# Patient Record
Sex: Female | Born: 1955
Health system: Southern US, Community
[De-identification: ages and names within clinical notes are randomized; demographics above are authoritative.]

## PROBLEM LIST (undated history)

## (undated) DIAGNOSIS — T7840XA Allergy, unspecified, initial encounter: Secondary | ICD-10-CM

## (undated) DIAGNOSIS — I2510 Atherosclerotic heart disease of native coronary artery without angina pectoris: Secondary | ICD-10-CM

## (undated) DIAGNOSIS — I10 Essential (primary) hypertension: Secondary | ICD-10-CM

## (undated) DIAGNOSIS — G629 Polyneuropathy, unspecified: Secondary | ICD-10-CM

## (undated) DIAGNOSIS — Z803 Family history of malignant neoplasm of breast: Secondary | ICD-10-CM

## (undated) DIAGNOSIS — G620 Drug-induced polyneuropathy: Secondary | ICD-10-CM

## (undated) DIAGNOSIS — R011 Cardiac murmur, unspecified: Secondary | ICD-10-CM

## (undated) DIAGNOSIS — E785 Hyperlipidemia, unspecified: Secondary | ICD-10-CM

## (undated) DIAGNOSIS — T451X5A Adverse effect of antineoplastic and immunosuppressive drugs, initial encounter: Secondary | ICD-10-CM

## (undated) DIAGNOSIS — C801 Malignant (primary) neoplasm, unspecified: Secondary | ICD-10-CM

## (undated) DIAGNOSIS — G709 Myoneural disorder, unspecified: Secondary | ICD-10-CM

## (undated) DIAGNOSIS — C50919 Malignant neoplasm of unspecified site of unspecified female breast: Secondary | ICD-10-CM

## (undated) DIAGNOSIS — J449 Chronic obstructive pulmonary disease, unspecified: Secondary | ICD-10-CM

## (undated) HISTORY — DX: Cardiac murmur, unspecified: R01.1

## (undated) HISTORY — DX: Myoneural disorder, unspecified: G70.9

## (undated) HISTORY — PX: LYMPH GLAND EXCISION: SHX13

## (undated) HISTORY — DX: Chronic obstructive pulmonary disease, unspecified: J44.9

## (undated) HISTORY — PX: OVARY SURGERY: SHX727

## (undated) HISTORY — DX: Hyperlipidemia, unspecified: E78.5

## (undated) HISTORY — PX: TONSILLECTOMY: SUR1361

## (undated) HISTORY — DX: Essential (primary) hypertension: I10

## (undated) HISTORY — DX: Malignant neoplasm of unspecified site of unspecified female breast: C50.919

## (undated) HISTORY — DX: Family history of malignant neoplasm of breast: Z80.3

## (undated) HISTORY — PX: CARPAL TUNNEL RELEASE: SHX101

## (undated) HISTORY — PX: ECTOPIC PREGNANCY SURGERY: SHX613

## (undated) HISTORY — DX: Adverse effect of antineoplastic and immunosuppressive drugs, initial encounter: G62.0

## (undated) HISTORY — DX: Drug-induced polyneuropathy: T45.1X5A

## (undated) HISTORY — DX: Atherosclerotic heart disease of native coronary artery without angina pectoris: I25.10

## (undated) HISTORY — PX: BREAST RECONSTRUCTION: SHX9

## (undated) HISTORY — DX: Polyneuropathy, unspecified: G62.9

## (undated) HISTORY — PX: APPENDECTOMY: SHX54

## (undated) HISTORY — DX: Allergy, unspecified, initial encounter: T78.40XA

---

## 1997-09-08 DIAGNOSIS — R011 Cardiac murmur, unspecified: Secondary | ICD-10-CM

## 1997-09-08 HISTORY — DX: Cardiac murmur, unspecified: R01.1

## 1998-03-20 ENCOUNTER — Other Ambulatory Visit: Admission: RE | Admit: 1998-03-20 | Discharge: 1998-03-20 | Payer: Self-pay | Admitting: Gynecology

## 1998-04-19 ENCOUNTER — Other Ambulatory Visit: Admission: RE | Admit: 1998-04-19 | Discharge: 1998-04-19 | Payer: Self-pay | Admitting: General Surgery

## 1998-04-30 ENCOUNTER — Ambulatory Visit (HOSPITAL_COMMUNITY): Admission: RE | Admit: 1998-04-30 | Discharge: 1998-04-30 | Payer: Self-pay | Admitting: General Surgery

## 1998-05-04 ENCOUNTER — Encounter: Admission: RE | Admit: 1998-05-04 | Discharge: 1998-08-02 | Payer: Self-pay | Admitting: General Surgery

## 1998-05-25 ENCOUNTER — Ambulatory Visit (HOSPITAL_COMMUNITY): Admission: RE | Admit: 1998-05-25 | Discharge: 1998-05-25 | Payer: Self-pay | Admitting: General Surgery

## 1998-05-25 ENCOUNTER — Encounter: Payer: Self-pay | Admitting: General Surgery

## 1998-06-19 ENCOUNTER — Encounter: Payer: Self-pay | Admitting: Hematology and Oncology

## 1998-06-19 ENCOUNTER — Ambulatory Visit (HOSPITAL_COMMUNITY): Admission: RE | Admit: 1998-06-19 | Discharge: 1998-06-19 | Payer: Self-pay | Admitting: Hematology and Oncology

## 1998-09-05 ENCOUNTER — Encounter: Admission: RE | Admit: 1998-09-05 | Discharge: 1998-12-04 | Payer: Self-pay | Admitting: Radiation Oncology

## 1999-08-29 ENCOUNTER — Other Ambulatory Visit: Admission: RE | Admit: 1999-08-29 | Discharge: 1999-08-29 | Payer: Self-pay | Admitting: Gynecology

## 1999-09-09 HISTORY — PX: TOTAL ABDOMINAL HYSTERECTOMY: SHX209

## 1999-09-16 ENCOUNTER — Encounter: Payer: Self-pay | Admitting: Hematology and Oncology

## 1999-09-16 ENCOUNTER — Encounter: Admission: RE | Admit: 1999-09-16 | Discharge: 1999-09-16 | Payer: Self-pay | Admitting: Hematology and Oncology

## 1999-09-17 ENCOUNTER — Other Ambulatory Visit: Admission: RE | Admit: 1999-09-17 | Discharge: 1999-09-17 | Payer: Self-pay | Admitting: Orthopedic Surgery

## 2000-03-16 ENCOUNTER — Encounter: Payer: Self-pay | Admitting: Hematology and Oncology

## 2000-03-16 ENCOUNTER — Encounter: Admission: RE | Admit: 2000-03-16 | Discharge: 2000-03-16 | Payer: Self-pay | Admitting: Hematology and Oncology

## 2000-04-02 ENCOUNTER — Encounter: Admission: RE | Admit: 2000-04-02 | Discharge: 2000-04-02 | Payer: Self-pay | Admitting: Hematology and Oncology

## 2000-04-02 ENCOUNTER — Encounter: Payer: Self-pay | Admitting: Hematology and Oncology

## 2000-09-17 ENCOUNTER — Encounter: Payer: Self-pay | Admitting: Hematology and Oncology

## 2000-09-17 ENCOUNTER — Encounter: Admission: RE | Admit: 2000-09-17 | Discharge: 2000-09-17 | Payer: Self-pay | Admitting: Hematology and Oncology

## 2001-04-15 ENCOUNTER — Other Ambulatory Visit: Admission: RE | Admit: 2001-04-15 | Discharge: 2001-04-15 | Payer: Self-pay | Admitting: Gynecology

## 2001-04-19 ENCOUNTER — Encounter: Payer: Self-pay | Admitting: Hematology and Oncology

## 2001-04-19 ENCOUNTER — Encounter: Admission: RE | Admit: 2001-04-19 | Discharge: 2001-04-19 | Payer: Self-pay | Admitting: Hematology and Oncology

## 2001-08-03 ENCOUNTER — Ambulatory Visit: Admission: RE | Admit: 2001-08-03 | Discharge: 2001-08-03 | Payer: Self-pay | Admitting: Gynecology

## 2001-08-09 ENCOUNTER — Encounter: Payer: Self-pay | Admitting: Gynecology

## 2001-08-10 ENCOUNTER — Encounter (INDEPENDENT_AMBULATORY_CARE_PROVIDER_SITE_OTHER): Payer: Self-pay

## 2001-08-10 ENCOUNTER — Inpatient Hospital Stay (HOSPITAL_COMMUNITY): Admission: RE | Admit: 2001-08-10 | Discharge: 2001-08-11 | Payer: Self-pay | Admitting: Gynecology

## 2001-09-23 ENCOUNTER — Encounter: Admission: RE | Admit: 2001-09-23 | Discharge: 2001-09-23 | Payer: Self-pay | Admitting: Hematology and Oncology

## 2001-09-23 ENCOUNTER — Encounter: Payer: Self-pay | Admitting: Hematology and Oncology

## 2001-12-06 ENCOUNTER — Encounter: Admission: RE | Admit: 2001-12-06 | Discharge: 2001-12-06 | Payer: Self-pay | Admitting: Hematology and Oncology

## 2001-12-06 ENCOUNTER — Encounter: Payer: Self-pay | Admitting: Hematology and Oncology

## 2002-05-26 ENCOUNTER — Encounter: Payer: Self-pay | Admitting: Oncology

## 2002-05-26 ENCOUNTER — Encounter: Admission: RE | Admit: 2002-05-26 | Discharge: 2002-05-26 | Payer: Self-pay | Admitting: Oncology

## 2002-09-28 ENCOUNTER — Encounter: Payer: Self-pay | Admitting: Oncology

## 2002-09-28 ENCOUNTER — Encounter: Admission: RE | Admit: 2002-09-28 | Discharge: 2002-09-28 | Payer: Self-pay | Admitting: Oncology

## 2002-12-29 ENCOUNTER — Other Ambulatory Visit: Admission: RE | Admit: 2002-12-29 | Discharge: 2002-12-29 | Payer: Self-pay | Admitting: Gynecology

## 2003-10-25 ENCOUNTER — Encounter: Admission: RE | Admit: 2003-10-25 | Discharge: 2003-10-25 | Payer: Self-pay | Admitting: Oncology

## 2004-08-14 ENCOUNTER — Other Ambulatory Visit: Admission: RE | Admit: 2004-08-14 | Discharge: 2004-08-14 | Payer: Self-pay | Admitting: Gynecology

## 2004-09-08 HISTORY — PX: MASTECTOMY: SHX3

## 2004-10-28 ENCOUNTER — Encounter: Admission: RE | Admit: 2004-10-28 | Discharge: 2004-10-28 | Payer: Self-pay | Admitting: Oncology

## 2004-11-05 ENCOUNTER — Encounter: Admission: RE | Admit: 2004-11-05 | Discharge: 2004-11-05 | Payer: Self-pay | Admitting: Oncology

## 2004-11-06 ENCOUNTER — Encounter (INDEPENDENT_AMBULATORY_CARE_PROVIDER_SITE_OTHER): Payer: Self-pay | Admitting: Specialist

## 2004-11-06 ENCOUNTER — Encounter: Admission: RE | Admit: 2004-11-06 | Discharge: 2004-11-06 | Payer: Self-pay | Admitting: Oncology

## 2004-11-12 ENCOUNTER — Encounter (INDEPENDENT_AMBULATORY_CARE_PROVIDER_SITE_OTHER): Payer: Self-pay | Admitting: Specialist

## 2004-11-12 ENCOUNTER — Encounter: Admission: RE | Admit: 2004-11-12 | Discharge: 2004-11-12 | Payer: Self-pay | Admitting: Oncology

## 2004-11-12 ENCOUNTER — Encounter (INDEPENDENT_AMBULATORY_CARE_PROVIDER_SITE_OTHER): Payer: Self-pay | Admitting: Diagnostic Radiology

## 2005-01-06 ENCOUNTER — Inpatient Hospital Stay (HOSPITAL_COMMUNITY): Admission: RE | Admit: 2005-01-06 | Discharge: 2005-01-08 | Payer: Self-pay | Admitting: Surgery

## 2005-01-06 ENCOUNTER — Encounter (INDEPENDENT_AMBULATORY_CARE_PROVIDER_SITE_OTHER): Payer: Self-pay | Admitting: *Deleted

## 2005-02-05 ENCOUNTER — Ambulatory Visit: Payer: Self-pay | Admitting: Oncology

## 2005-02-11 ENCOUNTER — Ambulatory Visit (HOSPITAL_COMMUNITY): Admission: RE | Admit: 2005-02-11 | Discharge: 2005-02-11 | Payer: Self-pay | Admitting: Oncology

## 2005-02-12 ENCOUNTER — Ambulatory Visit (HOSPITAL_COMMUNITY): Admission: RE | Admit: 2005-02-12 | Discharge: 2005-02-12 | Payer: Self-pay | Admitting: Oncology

## 2005-02-19 ENCOUNTER — Ambulatory Visit (HOSPITAL_COMMUNITY): Admission: RE | Admit: 2005-02-19 | Discharge: 2005-02-19 | Payer: Self-pay | Admitting: Oncology

## 2005-03-03 ENCOUNTER — Ambulatory Visit: Payer: Self-pay | Admitting: Oncology

## 2005-03-27 ENCOUNTER — Ambulatory Visit: Payer: Self-pay | Admitting: Oncology

## 2005-05-13 ENCOUNTER — Ambulatory Visit: Payer: Self-pay | Admitting: Oncology

## 2005-06-20 ENCOUNTER — Inpatient Hospital Stay (HOSPITAL_COMMUNITY): Admission: RE | Admit: 2005-06-20 | Discharge: 2005-06-23 | Payer: Self-pay | Admitting: Plastic Surgery

## 2005-10-01 ENCOUNTER — Ambulatory Visit: Payer: Self-pay | Admitting: Oncology

## 2005-10-18 ENCOUNTER — Emergency Department (HOSPITAL_COMMUNITY): Admission: EM | Admit: 2005-10-18 | Discharge: 2005-10-18 | Payer: Self-pay | Admitting: Emergency Medicine

## 2006-01-01 ENCOUNTER — Encounter: Admission: RE | Admit: 2006-01-01 | Discharge: 2006-01-01 | Payer: Self-pay | Admitting: Neurology

## 2006-01-20 ENCOUNTER — Ambulatory Visit: Payer: Self-pay | Admitting: Oncology

## 2006-01-22 LAB — CBC WITH DIFFERENTIAL/PLATELET
BASO%: 0.1 % (ref 0.0–2.0)
HCT: 39.1 % (ref 34.8–46.6)
LYMPH%: 14.9 % (ref 14.0–48.0)
MCHC: 34.3 g/dL (ref 32.0–36.0)
MCV: 89.1 fL (ref 81.0–101.0)
MONO#: 0.5 10*3/uL (ref 0.1–0.9)
NEUT%: 77.2 % — ABNORMAL HIGH (ref 39.6–76.8)
Platelets: 219 10*3/uL (ref 145–400)
WBC: 11 10*3/uL — ABNORMAL HIGH (ref 3.9–10.0)

## 2006-01-22 LAB — COMPREHENSIVE METABOLIC PANEL
AST: 12 U/L (ref 0–37)
BUN: 14 mg/dL (ref 6–23)
CO2: 27 mEq/L (ref 19–32)
Calcium: 9 mg/dL (ref 8.4–10.5)
Chloride: 100 mEq/L (ref 96–112)
Creatinine, Ser: 0.8 mg/dL (ref 0.4–1.2)

## 2006-05-06 ENCOUNTER — Other Ambulatory Visit: Admission: RE | Admit: 2006-05-06 | Discharge: 2006-05-06 | Payer: Self-pay | Admitting: Gynecology

## 2006-07-23 ENCOUNTER — Ambulatory Visit: Payer: Self-pay | Admitting: Oncology

## 2006-07-27 LAB — CBC WITH DIFFERENTIAL/PLATELET
BASO%: 0.3 % (ref 0.0–2.0)
EOS%: 3.6 % (ref 0.0–7.0)
MCH: 30.2 pg (ref 26.0–34.0)
MCHC: 34.3 g/dL (ref 32.0–36.0)
MONO#: 0.2 10*3/uL (ref 0.1–0.9)
RBC: 4.72 10*6/uL (ref 3.70–5.32)
WBC: 8.8 10*3/uL (ref 3.9–10.0)
lymph#: 1.7 10*3/uL (ref 0.9–3.3)

## 2006-07-27 LAB — COMPREHENSIVE METABOLIC PANEL
ALT: 17 U/L (ref 0–35)
AST: 16 U/L (ref 0–37)
CO2: 28 mEq/L (ref 19–32)
Calcium: 9.9 mg/dL (ref 8.4–10.5)
Chloride: 100 mEq/L (ref 96–112)
Sodium: 138 mEq/L (ref 135–145)
Total Bilirubin: 0.3 mg/dL (ref 0.3–1.2)
Total Protein: 7.1 g/dL (ref 6.0–8.3)

## 2006-08-20 ENCOUNTER — Encounter: Admission: RE | Admit: 2006-08-20 | Discharge: 2006-08-20 | Payer: Self-pay | Admitting: Oncology

## 2006-08-21 ENCOUNTER — Ambulatory Visit (HOSPITAL_COMMUNITY): Admission: RE | Admit: 2006-08-21 | Discharge: 2006-08-21 | Payer: Self-pay | Admitting: Oncology

## 2006-08-25 LAB — URINALYSIS, MICROSCOPIC - CHCC
Bilirubin (Urine): NEGATIVE
Glucose: NEGATIVE g/dL
Ketones: NEGATIVE mg/dL
RBC count: NEGATIVE (ref 0–2)
WBC, UA: NEGATIVE (ref 0–2)

## 2006-08-26 LAB — URINE CULTURE

## 2006-10-07 ENCOUNTER — Encounter
Admission: RE | Admit: 2006-10-07 | Discharge: 2007-01-05 | Payer: Self-pay | Admitting: Physical Medicine & Rehabilitation

## 2006-10-08 ENCOUNTER — Ambulatory Visit: Payer: Self-pay | Admitting: Physical Medicine & Rehabilitation

## 2006-12-10 ENCOUNTER — Ambulatory Visit: Payer: Self-pay | Admitting: Oncology

## 2006-12-15 LAB — COMPREHENSIVE METABOLIC PANEL
AST: 14 U/L (ref 0–37)
Albumin: 4.6 g/dL (ref 3.5–5.2)
Alkaline Phosphatase: 82 U/L (ref 39–117)
Glucose, Bld: 109 mg/dL — ABNORMAL HIGH (ref 70–99)
Potassium: 4.5 mEq/L (ref 3.5–5.3)
Sodium: 142 mEq/L (ref 135–145)
Total Bilirubin: 0.4 mg/dL (ref 0.3–1.2)
Total Protein: 7.5 g/dL (ref 6.0–8.3)

## 2006-12-15 LAB — CANCER ANTIGEN 27.29: CA 27.29: 37 U/mL (ref 0–39)

## 2006-12-15 LAB — CBC WITH DIFFERENTIAL/PLATELET
EOS%: 4 % (ref 0.0–7.0)
Eosinophils Absolute: 0.3 10*3/uL (ref 0.0–0.5)
LYMPH%: 20.2 % (ref 14.0–48.0)
MCH: 30.5 pg (ref 26.0–34.0)
MCHC: 34.9 g/dL (ref 32.0–36.0)
MCV: 87.4 fL (ref 81.0–101.0)
MONO%: 4.7 % (ref 0.0–13.0)
Platelets: 263 10*3/uL (ref 145–400)
RBC: 4.81 10*6/uL (ref 3.70–5.32)
RDW: 12.9 % (ref 11.3–14.5)

## 2007-06-11 ENCOUNTER — Ambulatory Visit: Payer: Self-pay | Admitting: Oncology

## 2007-06-15 ENCOUNTER — Encounter: Admission: RE | Admit: 2007-06-15 | Discharge: 2007-06-15 | Payer: Self-pay | Admitting: Family Medicine

## 2007-06-15 LAB — CBC WITH DIFFERENTIAL/PLATELET
BASO%: 0.2 % (ref 0.0–2.0)
Basophils Absolute: 0 10*3/uL (ref 0.0–0.1)
EOS%: 2.4 % (ref 0.0–7.0)
Eosinophils Absolute: 0.3 10*3/uL (ref 0.0–0.5)
HCT: 42.5 % (ref 34.8–46.6)
HGB: 15.1 g/dL (ref 11.6–15.9)
LYMPH%: 14.8 % (ref 14.0–48.0)
MCH: 31.5 pg (ref 26.0–34.0)
MCHC: 35.4 g/dL (ref 32.0–36.0)
MCV: 88.9 fL (ref 81.0–101.0)
MONO#: 0.2 10*3/uL (ref 0.1–0.9)
MONO%: 1.6 % (ref 0.0–13.0)
NEUT#: 9.4 10*3/uL — ABNORMAL HIGH (ref 1.5–6.5)
NEUT%: 81 % — ABNORMAL HIGH (ref 39.6–76.8)
Platelets: 275 10*3/uL (ref 145–400)
RBC: 4.78 10*6/uL (ref 3.70–5.32)
RDW: 12.7 % (ref 11.3–14.5)
WBC: 11.7 10*3/uL — ABNORMAL HIGH (ref 3.9–10.0)
lymph#: 1.7 10*3/uL (ref 0.9–3.3)

## 2007-06-15 LAB — COMPREHENSIVE METABOLIC PANEL
ALT: 12 U/L (ref 0–35)
Albumin: 4.9 g/dL (ref 3.5–5.2)
Alkaline Phosphatase: 86 U/L (ref 39–117)
CO2: 24 mEq/L (ref 19–32)
Potassium: 4.1 mEq/L (ref 3.5–5.3)
Sodium: 137 mEq/L (ref 135–145)
Total Bilirubin: 0.4 mg/dL (ref 0.3–1.2)
Total Protein: 7.8 g/dL (ref 6.0–8.3)

## 2007-12-10 ENCOUNTER — Ambulatory Visit: Payer: Self-pay | Admitting: Oncology

## 2007-12-14 LAB — LACTATE DEHYDROGENASE: LDH: 159 U/L (ref 94–250)

## 2007-12-14 LAB — COMPREHENSIVE METABOLIC PANEL
ALT: 15 U/L (ref 0–35)
BUN: 12 mg/dL (ref 6–23)
CO2: 25 mEq/L (ref 19–32)
Creatinine, Ser: 0.69 mg/dL (ref 0.40–1.20)
Glucose, Bld: 133 mg/dL — ABNORMAL HIGH (ref 70–99)
Total Bilirubin: 0.4 mg/dL (ref 0.3–1.2)

## 2007-12-14 LAB — CBC WITH DIFFERENTIAL/PLATELET
BASO%: 0.2 % (ref 0.0–2.0)
HCT: 41.8 % (ref 34.8–46.6)
LYMPH%: 15.7 % (ref 14.0–48.0)
MCHC: 35.2 g/dL (ref 32.0–36.0)
MONO#: 0.3 10*3/uL (ref 0.1–0.9)
NEUT%: 80.5 % — ABNORMAL HIGH (ref 39.6–76.8)
Platelets: 271 10*3/uL (ref 145–400)
WBC: 18.9 10*3/uL — ABNORMAL HIGH (ref 3.9–10.0)

## 2007-12-14 LAB — CANCER ANTIGEN 27.29: CA 27.29: 40 U/mL — ABNORMAL HIGH (ref 0–39)

## 2007-12-31 ENCOUNTER — Ambulatory Visit (HOSPITAL_COMMUNITY): Admission: RE | Admit: 2007-12-31 | Discharge: 2007-12-31 | Payer: Self-pay | Admitting: Oncology

## 2008-07-05 ENCOUNTER — Ambulatory Visit: Payer: Self-pay | Admitting: Oncology

## 2008-07-07 LAB — CBC WITH DIFFERENTIAL/PLATELET
BASO%: 0.1 % (ref 0.0–2.0)
EOS%: 3.4 % (ref 0.0–7.0)
MCH: 30.5 pg (ref 26.0–34.0)
MCHC: 34.4 g/dL (ref 32.0–36.0)
RDW: 12.3 % (ref 11.3–14.5)
lymph#: 2.2 10*3/uL (ref 0.9–3.3)

## 2008-07-10 LAB — COMPREHENSIVE METABOLIC PANEL
ALT: 16 U/L (ref 0–35)
AST: 17 U/L (ref 0–37)
Albumin: 4.6 g/dL (ref 3.5–5.2)
Calcium: 9.9 mg/dL (ref 8.4–10.5)
Chloride: 102 mEq/L (ref 96–112)
Potassium: 4.1 mEq/L (ref 3.5–5.3)
Sodium: 138 mEq/L (ref 135–145)

## 2008-07-17 ENCOUNTER — Encounter: Admission: RE | Admit: 2008-07-17 | Discharge: 2008-07-17 | Payer: Self-pay | Admitting: Oncology

## 2008-07-24 ENCOUNTER — Encounter: Admission: RE | Admit: 2008-07-24 | Discharge: 2008-07-24 | Payer: Self-pay | Admitting: Oncology

## 2008-09-28 ENCOUNTER — Ambulatory Visit (HOSPITAL_BASED_OUTPATIENT_CLINIC_OR_DEPARTMENT_OTHER): Admission: RE | Admit: 2008-09-28 | Discharge: 2008-09-28 | Payer: Self-pay | Admitting: Orthopedic Surgery

## 2009-01-24 ENCOUNTER — Ambulatory Visit: Payer: Self-pay | Admitting: Oncology

## 2009-01-26 LAB — CBC WITH DIFFERENTIAL/PLATELET
Basophils Absolute: 0.1 10*3/uL (ref 0.0–0.1)
Eosinophils Absolute: 0.5 10*3/uL (ref 0.0–0.5)
HCT: 39.2 % (ref 34.8–46.6)
HGB: 13.4 g/dL (ref 11.6–15.9)
MCV: 89 fL (ref 79.5–101.0)
MONO%: 4.5 % (ref 0.0–14.0)
NEUT#: 6.4 10*3/uL (ref 1.5–6.5)
NEUT%: 63.3 % (ref 38.4–76.8)
RDW: 12.3 % (ref 11.2–14.5)

## 2009-01-29 LAB — COMPREHENSIVE METABOLIC PANEL
Albumin: 4.3 g/dL (ref 3.5–5.2)
Alkaline Phosphatase: 87 U/L (ref 39–117)
BUN: 11 mg/dL (ref 6–23)
Calcium: 9.4 mg/dL (ref 8.4–10.5)
Chloride: 99 mEq/L (ref 96–112)
Creatinine, Ser: 0.72 mg/dL (ref 0.40–1.20)
Glucose, Bld: 95 mg/dL (ref 70–99)
Potassium: 3.9 mEq/L (ref 3.5–5.3)

## 2009-01-29 LAB — VITAMIN D 25 HYDROXY (VIT D DEFICIENCY, FRACTURES): Vit D, 25-Hydroxy: 45 ng/mL (ref 30–89)

## 2009-01-29 LAB — CANCER ANTIGEN 27.29: CA 27.29: 36 U/mL (ref 0–39)

## 2009-01-30 ENCOUNTER — Encounter: Admission: RE | Admit: 2009-01-30 | Discharge: 2009-01-30 | Payer: Self-pay | Admitting: Oncology

## 2009-07-18 ENCOUNTER — Encounter: Admission: RE | Admit: 2009-07-18 | Discharge: 2009-07-18 | Payer: Self-pay | Admitting: Oncology

## 2009-07-27 ENCOUNTER — Ambulatory Visit: Payer: Self-pay | Admitting: Oncology

## 2009-07-31 LAB — COMPREHENSIVE METABOLIC PANEL
ALT: 20 U/L (ref 0–35)
AST: 26 U/L (ref 0–37)
Alkaline Phosphatase: 86 U/L (ref 39–117)
CO2: 26 mEq/L (ref 19–32)
Creatinine, Ser: 0.63 mg/dL (ref 0.40–1.20)
Sodium: 132 mEq/L — ABNORMAL LOW (ref 135–145)
Total Bilirubin: 0.5 mg/dL (ref 0.3–1.2)
Total Protein: 7.2 g/dL (ref 6.0–8.3)

## 2009-07-31 LAB — CBC WITH DIFFERENTIAL/PLATELET
BASO%: 0.3 % (ref 0.0–2.0)
EOS%: 3.7 % (ref 0.0–7.0)
HCT: 41.7 % (ref 34.8–46.6)
LYMPH%: 22.5 % (ref 14.0–49.7)
MCH: 31 pg (ref 25.1–34.0)
MCHC: 34.5 g/dL (ref 31.5–36.0)
MONO%: 3.5 % (ref 0.0–14.0)
NEUT%: 70 % (ref 38.4–76.8)
Platelets: 251 10*3/uL (ref 145–400)
RBC: 4.64 10*6/uL (ref 3.70–5.45)

## 2009-07-31 LAB — LACTATE DEHYDROGENASE: LDH: 121 U/L (ref 94–250)

## 2010-01-15 ENCOUNTER — Encounter: Admission: RE | Admit: 2010-01-15 | Discharge: 2010-01-15 | Payer: Self-pay | Admitting: Oncology

## 2010-01-22 ENCOUNTER — Ambulatory Visit: Payer: Self-pay | Admitting: Oncology

## 2010-01-23 LAB — CBC WITH DIFFERENTIAL/PLATELET
BASO%: 0.2 % (ref 0.0–2.0)
EOS%: 3.9 % (ref 0.0–7.0)
MCHC: 34.3 g/dL (ref 31.5–36.0)
NEUT#: 6.7 10*3/uL — ABNORMAL HIGH (ref 1.5–6.5)
Platelets: 262 10*3/uL (ref 145–400)
RBC: 4.6 10*6/uL (ref 3.70–5.45)
WBC: 10.2 10*3/uL (ref 3.9–10.3)
lymph#: 2.8 10*3/uL (ref 0.9–3.3)

## 2010-01-23 LAB — COMPREHENSIVE METABOLIC PANEL
AST: 24 U/L (ref 0–37)
BUN: 8 mg/dL (ref 6–23)
CO2: 26 mEq/L (ref 19–32)
Calcium: 9.4 mg/dL (ref 8.4–10.5)
Glucose, Bld: 107 mg/dL — ABNORMAL HIGH (ref 70–99)
Potassium: 3.5 mEq/L (ref 3.5–5.3)
Sodium: 136 mEq/L (ref 135–145)

## 2010-01-24 LAB — CANCER ANTIGEN 27.29: CA 27.29: 42 U/mL — ABNORMAL HIGH (ref 0–39)

## 2010-01-24 LAB — VITAMIN D 25 HYDROXY (VIT D DEFICIENCY, FRACTURES): Vit D, 25-Hydroxy: 36 ng/mL (ref 30–89)

## 2010-08-20 ENCOUNTER — Encounter
Admission: RE | Admit: 2010-08-20 | Discharge: 2010-08-20 | Payer: Self-pay | Source: Home / Self Care | Attending: Neurology | Admitting: Neurology

## 2010-08-21 ENCOUNTER — Encounter
Admission: RE | Admit: 2010-08-21 | Discharge: 2010-08-21 | Payer: Self-pay | Source: Home / Self Care | Attending: Oncology | Admitting: Oncology

## 2010-09-29 ENCOUNTER — Encounter: Payer: Self-pay | Admitting: Oncology

## 2010-09-29 ENCOUNTER — Encounter: Payer: Self-pay | Admitting: Neurology

## 2010-12-23 LAB — POCT HEMOGLOBIN-HEMACUE: Hemoglobin: 14 g/dL (ref 12.0–15.0)

## 2011-01-20 ENCOUNTER — Encounter (HOSPITAL_BASED_OUTPATIENT_CLINIC_OR_DEPARTMENT_OTHER)
Admission: RE | Admit: 2011-01-20 | Discharge: 2011-01-20 | Disposition: A | Discharge status: Home / Self Care | Payer: Medicare Other | Source: Ambulatory Visit | Attending: Plastic Surgery | Admitting: Plastic Surgery

## 2011-01-21 NOTE — Op Note (Signed)
NAMETERRIN, IMPARATO NO.:  [id number]   MEDICAL RECORD NO.:  [id number]          PATIENT TYPE:  AMB   LOCATION:  DSC                          FACILITY:  [facility]   PHYSICIAN:  Katy Fitch. Sypher, M.D. DATE OF BIRTH:  Jul 03, 1956   DATE OF PROCEDURE:  09/28/2008  DATE OF DISCHARGE:                               OPERATIVE REPORT   PREOPERATIVE DIAGNOSES:  Chronic entrapped neuropathy, right median  nerve at carpal tunnel.   POSTOPERATIVE DIAGNOSES:  Chronic entrapped neuropathy, right median  nerve at carpal tunnel.   OPERATION:  Release of right transcarpal ligament.   OPERATING SURGEON:  Katy Fitch. Sypher, MD   ASSISTANT:  Marveen Reeks Dasnoit, PA   ANESTHESIA:  General by LMA.   SUPERVISING ANESTHESIOLOGIST:  Zenon Mayo, MD   INDICATIONS:  Tamara Webb is a 55 year old woman referred through  the courtesy of Dr. Porfirio Mylar Dohmeier, neurologist for evaluation of  carpal tunnel syndrome superimposed on a postchemotherapy peripheral  neuropathy.   Dr. Vickey Huger had worked up Tamara Webb thoroughly with clinical  examination and electrodiagnostic studies.  Tamara Webb is status post  bilateral mastectomy in 1999, with sentinel node dissection, radiation,  and chemotherapy.  She has had a disabling neuropathy.   Evaluation by Dr. Vickey Huger revealed segmental conduction deficits across  the carpal tunnel.  Therefore, we elected to proceed with release of the  transverse carpal ligament on the right, fully aware that we cannot  promise complete recovery of hand sensibility due to the underlying  neuropathy.   Our goal is to partially improve Tamara Webb's sense of numbness and  discomfort.  She has been transparently advised in the office as to our  inability to promise any specific outcome.   Preoperatively, questions were invited and answered in detail.   Also part of the preop discussion was the consideration of where to  place an IV for surgery.  She  had been advised by various members of the  general surgery team to avoid any IV access to her arms after  mastectomy.  We pointed out to her that there has been quite a bit of  investigation of this predicament in the hand surgery community due to  our need to take care of hand predicaments as well as obtain IV access  for surgery.  After a lengthy informed consent with myself where we  discussed neck access through the IV external jugular route versus leg  access versus arm access, she elected to go with an external jugular IV  that was placed without complication by Dr. Sampson Goon, the attending  anesthesiologist.   After informed consent, she was brought to the operating room at this  time.   PROCEDURE:  Tamara Webb was brought to the operating room and placed  in supine position on the operating table.   Following the induction of general anesthesia by LMA technique, the  right arm was prepped with Betadine soap solution and sterilely draped.  A pneumatic tourniquet was applied at the proximal right brachium.  Following exsanguination of the right arm with an Esmarch bandage, the  arterial tourniquet was inflated to 220 mmHg.  The procedure commenced  with a short incision in the line of the ring finger and the palm.  Subcutaneous tissues were carefully divided revealing the palmar fascia.  This was split longitudinally to reveal a common sensory branch of the  median nerve.  These were followed back to transcarpal ligaments, which  was gently isolated from the median nerve.  The ligament was gently  isolated from the median nerve proper with the aid of a Child psychotherapist.  Once a safe path was cleared, the ligament was released along  its ulnar border extending into the distal forearm with subcutaneous  passive scissors.   The contents of the carpal canal were inspected with a Sewall retractor  placed in the carpal canal in volar forearm space.  No masses or other   predicaments were appreciated.  The wound was then inspected for  bleeding points and repaired with intradermal 3-0 Prolene.   A compressive dressing was applied with a volar plaster splint  maintaining the wrist in 5 degrees of dorsiflexion.   For aftercare, Tamara Webb will be provided a prescription for Vicodin 5  mg 1 p.o. q.4-6 h. p.r.n. pain.  We will see her back in followup in our  office in 1 week.      Katy Fitch Sypher, M.D.  Electronically Signed     RVS/MEDQ  D:  09/28/2008  T:  09/28/2008  Job:  [id number]   cc:   Melvyn Novas, M.D.

## 2011-01-22 ENCOUNTER — Other Ambulatory Visit: Payer: Self-pay | Admitting: Oncology

## 2011-01-22 ENCOUNTER — Encounter (HOSPITAL_BASED_OUTPATIENT_CLINIC_OR_DEPARTMENT_OTHER): Payer: Medicare Other | Admitting: Oncology

## 2011-01-22 DIAGNOSIS — C50319 Malignant neoplasm of lower-inner quadrant of unspecified female breast: Secondary | ICD-10-CM

## 2011-01-22 LAB — CBC WITH DIFFERENTIAL/PLATELET
BASO%: 1.1 % (ref 0.0–2.0)
Basophils Absolute: 0.1 10*3/uL (ref 0.0–0.1)
Eosinophils Absolute: 0.2 10*3/uL (ref 0.0–0.5)
HCT: 39.5 % (ref 34.8–46.6)
MCHC: 34 g/dL (ref 31.5–36.0)
NEUT#: 7.5 10*3/uL — ABNORMAL HIGH (ref 1.5–6.5)
NEUT%: 75.1 % (ref 38.4–76.8)
Platelets: 220 10*3/uL (ref 145–400)
WBC: 10 10*3/uL (ref 3.9–10.3)

## 2011-01-23 ENCOUNTER — Ambulatory Visit (HOSPITAL_COMMUNITY)
Admission: AD | Admit: 2011-01-23 | Discharge: 2011-01-23 | Disposition: A | Discharge status: Home / Self Care | Payer: Medicare Other | Source: Ambulatory Visit | Attending: Plastic Surgery | Admitting: Plastic Surgery

## 2011-01-23 ENCOUNTER — Ambulatory Visit (HOSPITAL_BASED_OUTPATIENT_CLINIC_OR_DEPARTMENT_OTHER)
Admission: RE | Admit: 2011-01-23 | Discharge: 2011-01-23 | Disposition: A | Discharge status: Home / Self Care | Payer: Medicare Other | Source: Ambulatory Visit | Attending: Plastic Surgery | Admitting: Plastic Surgery

## 2011-01-23 DIAGNOSIS — Z01810 Encounter for preprocedural cardiovascular examination: Secondary | ICD-10-CM | POA: Insufficient documentation

## 2011-01-23 DIAGNOSIS — T8549XA Other mechanical complication of breast prosthesis and implant, initial encounter: Secondary | ICD-10-CM | POA: Insufficient documentation

## 2011-01-23 DIAGNOSIS — Y831 Surgical operation with implant of artificial internal device as the cause of abnormal reaction of the patient, or of later complication, without mention of misadventure at the time of the procedure: Secondary | ICD-10-CM | POA: Insufficient documentation

## 2011-01-23 DIAGNOSIS — Z01812 Encounter for preprocedural laboratory examination: Secondary | ICD-10-CM | POA: Insufficient documentation

## 2011-01-23 LAB — SURGICAL PCR SCREEN
MRSA, PCR: NEGATIVE
Staphylococcus aureus: NEGATIVE

## 2011-01-23 LAB — COMPREHENSIVE METABOLIC PANEL
ALT: 18 U/L (ref 0–35)
AST: 17 U/L (ref 0–37)
Alkaline Phosphatase: 96 U/L (ref 39–117)
Glucose, Bld: 123 mg/dL — ABNORMAL HIGH (ref 70–99)
Sodium: 136 mEq/L (ref 135–145)
Total Bilirubin: 0.3 mg/dL (ref 0.3–1.2)
Total Protein: 6.9 g/dL (ref 6.0–8.3)

## 2011-01-23 LAB — CANCER ANTIGEN 27.29: CA 27.29: 38 U/mL (ref 0–39)

## 2011-01-24 NOTE — Discharge Summary (Signed)
NAMEALLISA, Tamara Webb NO.:  [id number]   MEDICAL RECORD NO.:  [id number]          PATIENT TYPE:  INP   LOCATION:  5743                         FACILITY:  [facility]   PHYSICIAN:  Etter Sjogren, M.D.     DATE OF BIRTH:  [birth year]   DATE OF ADMISSION:  01/06/2005  DATE OF DISCHARGE:  01/08/2005                                 DISCHARGE SUMMARY   FINAL DIAGNOSES:  Breast cancer.   PROCEDURE PERFORMED:  1.  Bilateral mastectomies.  2.  Left sentinel lymph node.  3.  Right breast reconstruction with a tissue expander.  4.  Delay of a TRAM flap with closure of a complex abdominal wound.   HISTORY OF PRESENT ILLNESS:  This is a 54 year old woman who has had breast  cancer approximately five years and had lumpectomy and radiation. She now  has developed a recurrent breast cancer on that side. Mastectomy recommended  with sentinel lymph node biopsy. In addition, she has problems with followup  of the other breast, and it is recommended that the other breast be removed  as well. Her reconstruction is complex as she has had previous radiation and  has a small panniculus. She prefers to use her own tissue for the left side  and a TRAM flap would be delayed for that. Right side will be reconstructed  with a tissue expander. Different techniques utilized simply because she has  such a small panniculus bilateral breast reconstruction with it would not be  possible. For details of history and physical, please see the chart.   COURSE IN THE HOSPITAL:  Initially, she was taken to surgery at which time  the mastectomies and the reconstruction of right side of the tissue expander  and delay of the TRAM flap closure of the wound on the abdomen were all  performed. She tolerated that well. Postoperatively, she did well. She  remained afebrile with stable vital signs. Drains functioning. She was  tolerating a regular diet on the first postoperative day. She was  ambulating. By the second  postoperative day, it was felt that she was ready  to be discharged.   DISPOSITION:  Discharged on a regular diet. No shower yet. Empty the drains  three times a day and record the amount. She is to use her spirometer at  home. No smoking. She understands that smoking places her at increased risk  for healing problems, and she did smoke up to the time of this surgery. She  will be on [facility] for pain 1 p.o. q.3h. p.r.n. pain and Cleocin 300  mg p.o. 4 times a day for another 3 days.      DB/MEDQ  D:  01/08/2005  T:  01/08/2005  Job:  [id number]

## 2011-01-24 NOTE — Op Note (Signed)
Tamara Webb, MOLNER NO.:  [id number]   MEDICAL RECORD NO.:  [id number]          PATIENT TYPE:  INP   LOCATION:  2550                         FACILITY:  [facility]   PHYSICIAN:  Currie Paris, M.D.DATE OF BIRTH:  [birth year]   DATE OF PROCEDURE:  01/06/2005  DATE OF DISCHARGE:                                 OPERATIVE REPORT   PREOPERATIVE DIAGNOSIS:  Carcinoma left breast, recurrent.   POSTOPERATIVE DIAGNOSIS:  Carcinoma left breast, recurrent.   OPERATIONS:  1.  Left total mastectomy with blue dye injection and sentinel node biopsy      (one node).  2.  Right total mastectomy (prophylactic).   SURGEON:  Currie Paris, M.D.   ASSISTANT:  Anselm Pancoast. Zachery Dakins, M.D.   ANESTHESIA:  General endotracheal.   CLINICAL HISTORY:  This is a 55 year old lady who is three years status post  a lumpectomy on the left side for breast cancer followed by radiation.  She  recently was found to have a recurrence nearby the scar.  Total mastectomy  with sentinel node biopsy was recommended.  The patient had an MRI, and no  other lesions were seen in either breast, but she had an allergic reaction  to her MRI dye, precluding the ability to do further MRI scans for following  the right breast.  After long thought about this issue and discussion with  her other physicians, she elected to have a prophylactic right mastectomy.  She saw Dr. Odis Luster and wished to have a total TRAM flap done on the left,  and his plan was for a delayed TRAM as well as a tissue expander on the  right side.   DESCRIPTION OF PROCEDURE:  The patient was seen in the holding area and had  no further questions.  We confirmed the plan for procedure, and Dr. Odis Luster  came in and did the appropriate marking so that we had everything clarified  for the surgery, and this was done jointly with him, myself, and the  patient.   The patient was taken to the operating room, and after satisfactory general  anesthesia had been obtained, the area of the left nipple was prepped, and  the timeout occurred.  I injected blue dye using methylene blue.  The breast  and chest and abdomen were prepped and draped as a single sterile field.   I approached the left side first and used a Neoprobe to identify a hot area  and marked the skin in the axilla near her old scar.  I made an elliptical  transverse breast incision and raised the skin flap medially to the sternum,  superiorly to the clavicle, and laterally out into the axilla.  Once I had  raised up the skin over the area I had marked as the area of the hot area, I  used the Neoprobe to identify the hot area.   We never saw any blue lymphatics coming into this area but found a clip. A  small blood vessel that was tied, and about a 1.4 cm node that was felt  somewhat firm and had counts  of about 500.  Once this was excised, there was  no palpable adenopathy in the axilla, no blue nodes or blue lymphatics  noted, and no other hot areas.  This terminated the sentinel node  dissection, and the tissue was sent to pathology.   I made the inferior incision and raised the skin flap inferior to the  inframammary fold and laterally to the latissimus.  The breast was removed  starting medially and working laterally, taking the fascia.  Once everything  was completed, I put two JP drains and a moist pack.  We made sure  everything was dry.  Using new instruments, gloves, etc., the right side was  approached.  I made a transverse incision going around the nipple-areolar  complex as a skin-sparing mastectomy.  I raised skin flaps identical to the  left side but did not really enter the axilla proper.  The breast was  removed from medial to lateral, this time leaving the muscle fascia behind.  Once everything was dry, a moist pack was placed, and Dr. Odis Luster came in to  close.  He planned to do a delayed TRAM as well as place a tissue expander  when he closed the  right side.  He elected to close the left side after he  had finished so that the arm could be at the side while that was done.   The patient tolerated the procedure well.  There were no operative  complications.  All counts were correct.      CJS/MEDQ  D:  01/06/2005  T:  01/07/2005  Job:  [id number]

## 2011-01-24 NOTE — Op Note (Signed)
NAMEKEMARA, QUIGLEY NO.:  [id number]   MEDICAL RECORD NO.:  [id number]          PATIENT TYPE:  INP   LOCATION:  2550                         FACILITY:  [facility]   PHYSICIAN:  Etter Sjogren, M.D.     DATE OF BIRTH:  July 16, 1956   DATE OF PROCEDURE:  01/07/2005  DATE OF DISCHARGE:                                 OPERATIVE REPORT   PREOPERATIVE DIAGNOSIS:  Left breast cancer.   POSTOPERATIVE DIAGNOSIS:  Left breast cancer with bilateral mastectomies.   PROCEDURES PERFORMED:  1.  Right breast reconstruction with tissue expander.  2.  Delay of a transverse rectus abdominis myocutaneous flap.  3.  Complex wound closure, abdomen, 30 cm.   SURGEON:  Etter Sjogren, M.D.   ANESTHESIA:  General.   ESTIMATED BLOOD LOSS:  Minimal.   DRAINS:  There were two Blakes left, each side of the chest.   CLINICAL NOTE:  A 55 year old woman has breast cancer.  She was allergic to  the MRI material that was given IV for the MRI, and a bilateral mastectomy  was recommended because follow-up would be very difficult for her.  Mammograms have not proven to be helpful in detecting breast cancer in her  either last time that she had it nor this time.  She has had previous  radiation, left side and for reconstruction needed autogenous tissue, and  she elected a TRAM flap.  On the right side since she had a very small  panniculus and the entire TRAM flap would be needed for the left side, on  the right side it was planned to use a tissue expander followed by implant.  This was discussed with her in great detail.  She understood these were very  different techniques and that there would be asymmetries, and she was very  accepting of that.  She wished to proceed.   PROCEDURE:  The patient had been marked in a full sitting position for the  TRAM delay procedure as well as inframammary creases.  She was then taken to  the operating room and placed supine.  The general surgery mastectomy was  then performed.  She tolerated that very well.   The right chest was addressed using a muscle-splitting incision to carry the  dissection deep to the pectoralis major muscle.  A submuscular pocket was  developed and meticulous hemostasis with the electrocautery.  Thorough  irrigation with antibiotic solution and excellent hemostasis having been  confirmed, the tissue expander was prepared.  This was a McGhan style ,  300 mL tissue expander, lot number [id number].  After cleaning gloves, the  implant was prepared, soaked in an antibiotic solution.  The submuscular  pocket had been irrigated with antibiotic solution.  The expander was filled  with 100 mL of sterile saline using a closed fill system and all the air  removed.  The tissue expander was then positioned in the submuscular pocket  to the very depths of the pocket inferiorly.  The muscle closure with 3-0  Vicryl interrupted horizontal mattress sutures.  A Blake drain was  positioned, brought through separate stab  wounds inferior, inferolaterally,  and secured with 3-0 Prolene sutures.  Meticulous hemostasis again for the  mastectomy area underneath the mastectomy flaps after thoroughly irrigating  with antibiotic solution and then saline.  The remainder of the closure with  3-0 Monocryl interrupted, inverted deep sutures and 3-0 Monocryl running  subcuticular suture.   The left chest had been left open by the general surgeon.  Meticulous  hemostasis with electrocautery.  The wound was irrigated with saline.  The  closure with 2-0 Monocryl interrupted, inverted deep sutures and skin  staples per Dr. Tenna Child request.   Attention then turned to the abdomen.  The abdominal incision was then made  and the dissection carried down through Scarpa's fascia, down to the  underlying deep fascia.  The lateral borders of the rectus muscles were  identified bilaterally and the fascia opened.  The muscles were then  retracted, identifying  the deep inferior epigastric vessels.  The deep  inferior epigastric vessels were mobilized and triple-ligated proximal and  distal and divided.  Care was taken to make sure that these vascular clips  were intact, and they were.  The wounds were irrigated with saline.  Meticulous hemostasis again with electrocautery, and the fascial closure  with 0 Prolene interrupted figure-of-eight sutures.  The Scarpa's layer was  then closed with 3-0 Monocryl interrupted, inverted deep sutures and the  remainder of the closure with 3-0 Monocryl interrupted, inverted deep dermal  sutures.  Steri-Strips and dry sterile dressing applied.  Steri-Strips  applied to the right chest wound as well.  A circumferential Ace wrap was  placed.  She was transferred to the recovery room in stable condition,  having tolerated the procedure well.      DB/MEDQ  D:  01/06/2005  T:  01/07/2005  Job:  [id number]

## 2011-01-24 NOTE — Assessment & Plan Note (Signed)
A 55 year old female seen by me in initial consultation October 08, 2006.  She has a history of breast carcinoma, status post mastectomy,  radiation therapy in December 1999, adjuvant chemotherapy 4 cycles  December 1999.  Recurrence of breast carcinoma, status post left  mastectomy with node biopsy Jan 06, 2005 and status post reconstruction  procedures.  A second round of chemotherapy October 2006.   Started on Cymbalta last visit, 30 mg q. a.m.  She feels like it helped  with some of the numbness, tingling in the upper extremity,  particularly.  Mood-wise doing a bit better.  Sleep, she feels like she  is sleeping excessively.  A couple of days she actually slept in until  about 10 a.m.  She goes to bed around 11 or 11:30 at night.  Her urinary  drug screen came back showing the oxycodone as expected.  She has not  followed up with aquatic therapy.  She has tried the Lidoderm patch  samples and this has been helpful.  She would like a prescription for  these.   EXAMINATION:  GENERAL:  No acute distress.  Mood and affect are appropriate.  Her back has no tenderness to palpation.  Neck has good range of motion.  Deep tendon reflexes are 1+ bilateral upper and lower extremities.  Strength is normal bilateral upper and lower extremities.  Sensation is  decreased in a glove distribution in the upper extremities, intact in  the lower extremities to pinprick.  She had no evidence of fasciculations, no evidence of skin color changes  in the upper and lower extremities.  VITAL SIGNS:  124/80, pulse 105, respiratory rate 16, O2 satting 100% on  room air.  GENERAL:  No acute distress.  Mood and affect bright, alert.   IMPRESSION:  Peripheral neuropathy due to effects of chemotherapy with  paresthesias, dysesthesias.   PLAN:  1. Will continue Cymbalta.  2. Continue Endocet 10/325 t.i.d.  3. Once she is stable on Cymbalta increase the Neurontin to 600 q.i.d.  4. Instruct her to stretching  exercise for upper anterior chest.   I will see her back in 1 month.      Erick Colace, M.D.  Electronically Signed     AEK/MedQ  D:  10/23/2006 14:49:11  T:  10/23/2006 15:40:24  Job #:  [id number]   cc:   Melvyn Novas, M.D.  Fax: [phone]

## 2011-01-24 NOTE — Consult Note (Signed)
[facility]  Patient:    Tamara Webb, Tamara Webb Visit Number: [id number] MRN: [id number]          Service Type: GON Location: GYN Attending Physician:  Jeannette Corpus Dictated by:   Rande Brunt. Clarke-Pearson, M.D. Proc. Date: 08/03/01 Admit Date:  08/03/2001 Discharge Date: 08/03/2001   CC:         Telford Nab, R.N.  Leatha Gilding. Mezer, M.D.   Consultation Report  REASON FOR CONSULTATION:  [age]-year-old white female referred by Dr. Dimas Aguas C. Mezer for evaluation of a cystic pelvic mass.  The patient has had a recent ultrasound showing that she has a left ovarian cyst measuring 5.4 x 4.8 cm and an adjacent tubular structure measuring 5.6 x 2.6 cm, as well as presence of free fluid.  Patient has a past gynecologic history of an ectopic pregnancy and also a tubal reconstructive procedure and it is thought that she may have a hydrosalpinx.  She has also had an appendectomy in the past.  CA125 value was 13.4.  The patient denies any significant pelvic symptoms, specifically has no pelvic pain, pressure, GI or GU symptoms.  PAST MEDICAL HISTORY:  Breast cancer and sentinel node with negative nodes (stage II).  She was ER-positive, PR-negative.  She received adjuvant Cytoxan and Adriamycin chemotherapy for four cycles, completed in December 1999; she then received adjuvant radiation therapy to left breast.  She was then started on tamoxifen in February 2000.  She has had no evidence of recurrent disease to date.  CURRENT MEDICATIONS:  Tamoxifen, Wellbutrin, multivitamins.  DRUG ALLERGIES:  PENICILLIN.  PAST SURGICAL HISTORY:  Ectopic pregnancy, tubal reconstruction, appendectomy, excisional breast biopsy and sentinel node biopsy.  FAMILY HISTORY:  Negative for gynecologic, GI or GU or breast cancers.  SOCIAL HISTORY:  The patient is married.  She is a Transport planner at [employer].  She smokes one pack per day and comes accompanied  by her husband.  PHYSICAL EXAMINATION:  VITAL SIGNS:  Height 5 feet 3 inches.  Weight 117 pounds.  GENERAL:  The patient is a healthy slender white female in no acute distress.  HEENT:  Negative.  NECK:  Supple without thyromegaly.  NODES:  There is no supraclavicular or inguinal adenopathy.  ABDOMEN:  Soft and nontender.  No masses, organomegaly, ascites or herniae are noted.  Her Pfannenstiel incision and umbilical laparoscopic incisions are all well-healed.  No herniae are noted.  PELVIC:  EGBUS normal.  Vagina is clean.  Cervix is normal.  Uterus is anterior and normal shape, size and consistency.  On bimanual exam, I do not specifically feel the pelvic mass.  Rectovaginal exam confirms.  IMPRESSION:  Complex pelvic mass with free fluid based on ultrasound findings with normal CA125.  I believe the patient most likely has a benign ovarian neoplasm but agree that surgical resection and pathologic evaluation are reasonable.  The recommendation for a total abdominal hysterectomy and bilateral salpingo-oophorectomy also seems reasonable.  The risks of surgery including hemorrhage, infection, injury to adjacent viscera and thromboembolic complications as well as anesthetic risks were outlined to the patient and her husband.  I will plan on scrubbing with Dr. Chevis Pretty at surgery next week. Dictated by:   Rande Brunt. Clarke-Pearson, M.D. Attending Physician:  Jeannette Corpus DD:  08/04/01 TD:  08/05/01 Job: [id number]

## 2011-01-24 NOTE — Op Note (Signed)
[facility]  Patient:    Tamara Webb, Tamara Webb Visit Number: [id number] MRN: [id number]          Service Type: GYN Location: 4W 0460 01 Attending Physician:  Teodora Medici Cabitt Proc. Date: 08/10/01 Admit Date:  08/10/2001   CC:         Leatha Gilding. Mezer, M.D.             Dagmar Hait, R.N.                           Operative Report  PREOPERATIVE DIAGNOSIS:  Complex pelvic mass.  POSTOPERATIVE DIAGNOSIS:  Retroperitoneal fibrosis.  PROCEDURE PERFORMED:  Left ureterolysis.  SURGEON:  Daniel L. Clarke-Pearson, M.D.  ASSISTANT:  Leatha Gilding. Mezer, M.D.  ANESTHESIA:  General with orotracheal tube.  ESTIMATED BLOOD LOSS:  200 cc for the entire procedure.  FINDINGS:  At exploratory laparotomy, the patient had bilateral adnexal masses.  The right adnexal mass arose from the ovary and measured approximately 6 cm in diameter.  The left adnexal mass was a hydrosalpinx, which was densely adherent to the pelvic sidewall as well as the ovary, which was adherent to the pelvic sidewall as well.  There was retroperitoneal fibrosis in this portion of the pelvis encasing the ureter.  In order to protect the ureter from the dissection and in order to complete a resected tube and ovary on the left side, ureterolysis was performed.  DESCRIPTION OF PROCEDURE:  The patient had previously been entered through a Pfannenstiel incision.  A Bookwalter retractor was positioned and the bowel packed out of the pelvis.  Adhesions of the fallopian tube and hydrosalpinx o the left side were lysed.  The retroperitoneal space was opened identifying the external iliac artery, vein, hypogastric artery and ureter.  The ovarian vessels were skeletonized, clamped, cut, suture free tied, and suture ligated. The ureter was densely adherent to retroperitoneal fibrosis and densely adherent to the tube and ovary which were adherent to the pelvic sidewall. Using sharp and blunt dissection  and a right angle clamp, the ureter was dissected free from the retroperitoneal fibrosis.  Hemostasis was achieved with hemoclips and occasional cautery.  The tube and ovary were elevated further and the ureter dissected down until it entered the paracervical tunnel at which point the tube and ovary were entirely freed.  They were removed.  The procedure continued performing a total abdominal hysterectomy and bilateral salpingo-oophorectomy with Dr. Chevis Pretty as the primary surgeon.  This will be dictated separately.  Sponge, needle, and instrument counts were correct x 2. Attending Physician:  Rolinda Roan DD:  08/10/01 TD:  08/10/01 Job: [id number]

## 2011-01-24 NOTE — Op Note (Signed)
[facility]  Patient:    Tamara Webb, Tamara Webb Visit Number: [id number] MRN: [id number]          Service Type: GYN Location: 4W [id number] 01 Attending Physician:  Teodora Medici Cabitt Dictated by:   Leatha Gilding. Mezer, M.D. Proc. Date: 08/10/01 Admit Date:  08/10/2001   CC:         [name], M.D.  Lowell C. Catha Gosselin, M.D.   Operative Report  PREOPERATIVE DIAGNOSIS:  Pelvic mass.  POSTOPERATIVE DIAGNOSES: 1. Benign ovarian cyst (right). 2. Left hydrosalpinx. 3. Pelvic adhesions.  OPERATION: 1. Total abdominal hysterectomy. 2. Bilateral salpingo-oophorectomy.  SURGEON:  Leatha Gilding. Mezer, M.D.  ASSISTANT:  Rande Brunt. Clarke-Pearson, M.D./Nancy Aundria Rud, R.N.  ANESTHESIA:  General endotracheal.  PREPARATION:  Betadine.  DESCRIPTION OF PROCEDURE:  With the patient in the modified lithotomy position, she was prepped and draped in the usual fashion.  An incision was made through a previous Pfannenstiel incision and the fascia and perineum opened without difficulty.  No pelvic washings were obtained.  Brief exploration of her upper abdomen was benign.  There were adhesions of omentum at the anterior abdominal wall which were taken down with cautery.  Upon entering the peritoneal cavity it was noted that the uterus was top normal in size.  The right ovary contained a cyst approximately 4 cm in diameter and there was a large left distal hydrosalpinx that was adherent to the colon. Normal anatomy was restored by taking the adhesions down between the hydrosalpinx and the colon extending to the pelvis side wall.  The ovary was densely adherent to the pelvic side wall on the left side.  The right ovary was free of adhesions.  The round ligaments were divided by cautery and the right pelvic side wall opened.  It was noted that there was a significant amount of scarring in the parametrial tissue on the right side.  The ureter was identified.  The  infundibular pelvic ligament isolated, clamped, cut and free tied with 2-0 Vicryl and then suture ligated with 2-0 Vicryl.  The ovary was further dissected free and sent to pathology for frozen section analysis. The frozen section was returned as a corpus luteum cyst.  The bladder was quite adherent to the lower part of the uterus primarily in the center and the right side.  Careful attention was paid to dissecting the parametrial tissue to avoid the ureter and the bladder.  It appeared that there was no harm done. Attention was then directed to the left side.  The round ligament was divided. The ureter identified and the infundibular pelvic ligament isolated, clamped, cut and free tied with 2-0 Vicryl and then suture ligated with 2-0 Vicryl. The hydrosalpinx was then easily elevated and removed.  The ovary; however, was densely adherent directly over the ureter and required extensive ureterolysis which was performed by Dr. Rande Brunt. Clarke-Pearson as the Paramedic.  There was a significant amount of scarring and this procedure was dictated under a separate note to properly document the extent of disease and extensive dissection required to safely perform the procedure. The uterine arteries were then clamped cut and suture ligated with 2-0 Vicryl suture.  The cardinal ligaments were then taken in several bites, clamped, cut and suture ligated with 2-0 Vicryl.  The rectovaginal septum had been developed.  The vagina entered bilaterally and angle sutures of 0 Vicryl were placed. The cuff was then closed with interrupted and interrupted figure-of-eight 0 Vicryl sutures.  There were numerous bleeding  points from the scar tissue on both pelvic side walls which was arrested with cautery. The bladder area appeared to be dry.  With hemostasis intact, effort was made to place the large bowel in the cul-de-sac.  The omentum was brought down. The abdomen was closed in layers using a running 2-0  Vicryl in the peritoneum, running 1 PDS to midline bilaterally on the fascia.  The fascia was undermined to elevate the tissue as she had had previous surgeries and there was a significant dip and the skin was closed with staples.  The estimated blood loss was approximately 200 cc.  The sponge, instrument and needle counts were correct times two.  The patient tolerated the procedure well and was taken to the recovery room in satisfactory condition. Dictated by:   Leatha Gilding. Mezer, M.D. Attending Physician:  Rolinda Roan DD:  08/10/01 TD:  08/10/01 Job: [id number]

## 2011-01-24 NOTE — Group Therapy Note (Signed)
Thursday, October 08, 2006.   INITIAL CONSULTATION:   REQUESTING PHYSICIAN:  Melvyn Novas, M.D.   REASON FOR CONSULTATION:  Pain management for peripheral neuropathy.   Patient is a 55 year old female who has a history of breast carcinoma  which was initially diagnosed around 1999, per patient report.  She  underwent a meniscectomy, radiation therapy in December, 1999.  She had  adjuvant cytoxan and Adriamycin, four cycles completed in December,  1999.  Started on tamoxifen in 2000.  She also had a history of a pelvis  mass and underwent exploration.  It turned out to be retroperitoneal  fibrosis.  She had recurrence of the breast carcinoma  and underwent  left total mastectomy with node biopsy on Jan 06, 2005.  She underwent  reconstruction of left breast, had a myocutaneous flap, transversus  abdominis.  She had no postoperative complications.  She had right  breast removal.  She had some further plastic surgery done on October,  2006.  Bilateral breast reconstruction.  No complications with this.  Patient's second round of chemotherapy was just prior to October, 2006.  She has had a follow-up bone scan showing no evidence of metastatic  disease on August 11, 2006.  CT pelvis and chest showed no metastatic  involvement of her abdomen or her pelvis.   No further treatment of the breast cancer is currently planned.   Her main complaint is arm and leg pain, which have increased since the  recurrence of breast carcinoma in 2006.  She states that this has been  accompanied by fatigue, and she actually stopped working in 12/2005  because of her fatigue.  Her pain while taking medications is at a 3-4  level.  Without it, 10.  She has fair-to-good relief with medications.  Her sleep is good when she takes the meds.  The pain is exacerbated by  activity.  Improved by rest and pacing activities in addition to her  medication.  She is independent with all of her self-care.  Needs some  help with shopping and household duties.   Review of systems is positive for constipation, but this is improved  with Colace.  She has some foot swelling at times.  She has some  confusion and anxiety but no suicidal thoughts.  Trouble walking due to  numbness and fatigue.   SOCIAL HISTORY:  She is married and lives with her husband.  Smokes a  half pack a day.  Denies any illegal drug use or alcohol abuse.   FAMILY HISTORY:  Parents with heart disease and diabetes.  Alcohol abuse  in the father.  MS in the mother.   Additional workup done through neurology included a C-spine and MRI,  demonstrating no significant stenosis.   MEDICATIONS:  Darvocet-N 100.  She is no longer on this.  She is being  switched to hydrocodone 10/325 and then switched to oxycodone 10/325,  which has helped the most.  She takes approximately four tablets per  day.   In terms of neuropathic pain medication, she is currently on Neurontin  600 t.i.d.  Had been tried on Lyrica but states that she had some side  effects, drowsiness, as well as questionable efficacy, but she does not  remember what dosage she got up to.  She has had repeat EMG  demonstrating mild carpal tunnel bilaterally.  She has had  antineoplastic antibody panel, which was negative.   PHYSICAL EXAMINATION:  GENERAL:  No acute distress.  Mood and affect  mildly  anxious.  NEUROMUSCULAR:  Gait is normal.  Orientation x3.  She is able to toe-  walk, heel-walk.  She has no  tremor.  [facility] internal medicine  resident accompanied me on the examination but did not perform.  VITAL SIGNS:  Blood pressure 121/86, pulse 86, respiratory rate 15, O2  sat is  99% on room air.  SKIN:  She has no hypersensitivity over her mastectomy scars.  No  evidence of skin changes over the chest area, the back area, or the  hands and feet.  No vasomotor changes noted.  Her sensation is intact.  Her proprioception as well as pinprick, although light touch has  some  diminishment in a nondermatomal fashion.  No finger-glove/stocking-type  distribution either.  Her motor strength is 5/5 bilaterally deltoid,  biceps, triceps and grip, as well as hip flexion, knee extensor and  ankle dorsiflexion.  She has normal range of motion in bilateral upper  and lower extremities, shoulders, spine, and neck.  There is no  tenderness to palpation over the fibromyalgia tender points.   IMPRESSION:  Neuropathic pain secondary to chemotherapeutic agents.  She  may in addition have some radiation reflexopathy accounting for some of  the upper extremity symptoms; however, EMG was negative.  She had more  of a small fiber neuropathy.   PLAN:  1. I think that we can justify the use of narcotic analgesics in this      situation if she has a clean urine drug screen but that the primary      treatment modality will be adjuvant medications.  Recommended first      to increase her Neurontin to 600 mg q.i.d. and if this is not      helpful, could switch to other agents.  2. Start Cymbalta 30 q.a.m.  I did mention the black-box warning of      increased suicidality risk.  She understands to stop this      medication if she becomes more depressed.  We did discuss that the      Cymbalta may also have some pain-relieving effects.  3. Will not prescribe oxycodone until UDS is back, as recommended,      going down to 3 times a day, since we are adding the other      medication.  4. Also recommended aquatic therapy, which can be done at a local      [facility].  5. Discussed the utility of acupuncture in this situation, and she      will research her insurance benefits for this procedure.   Should all other options be exhausted, consideration for spinal cord  stimulator; however, given that she has both upper and lower limb  stimulator placement, would likely not relieve her entire symptom  complex.  I will see her back in two weeks.  We have also written prescription for   Lidoderm patch, which she can use over the hypersensitive area in her  left upper chest, which is just superior to the scar.      Erick Colace, M.D.  Electronically Signed     AEK/MedQ  D:  10/08/2006 17:11:41  T:  10/08/2006 18:49:47  Job #:  [id number]   cc:   Melvyn Novas, M.D.  Fax: [phone]   Demetria Pore. Coral Spikes, M.D.  Fax: [phone]

## 2011-01-24 NOTE — Discharge Summary (Signed)
[name], Tamara Webb NO.:  [id number]   MEDICAL RECORD NO.:  [id number]          PATIENT TYPE:  INP   LOCATION:  5704                         FACILITY:  [facility]   PHYSICIAN:  Etter Sjogren, M.D.     DATE OF BIRTH:  11-Jan-1956   DATE OF ADMISSION:  06/20/2005  DATE OF DISCHARGE:                                 DISCHARGE SUMMARY   CONDITION AT DISCHARGE:  Excellent.   FINAL DIAGNOSIS:  Breast cancer.   PROCEDURE:  1.  Left breast reconstruction with an ipsilateral tranverse rectus      abdominis musculocutaneous flap.  2.  Right breast reconstruction with removal of expander and placement of      implant.  3.  Placement of an ON-Q pump.   HISTORY AND PHYSICAL:  A 55 year old woman with breast cancer who has  bilateral mastectomy, has had radiation previously on the left side. The  reconstruction was planned using a tissue expander followed by implant. The  tissue expander was placed at the time of the bilateral mastectomy some  months ago. The left side had to be reconstructed with her own tissue due to  the radiation that she received. She understood that there would be  asymmetries and wished to proceed.   COURSE IN THE HOSPITAL:  On admission she was taken to surgery at which time  the procedures were performed with bilateral breast reconstruction. She  tolerated that well. Tram flap maintained excellent color. The drain was  functioning, excellent urine output. The following day, she was ambulatory,  tolerating clear liquids, and then gradually tolerating solid food by the  time of discharge, afebrile, chest soft, no hematoma, no fluid collections.  Drain #2 which is in the right breast reconstruction where the implant is  located is draining just minimal amounts and is removed prior to discharge.  The ON-Q pump also removed prior to discharge.   DISPOSITION:  No lifting, no vigorous activities, no raising arms overhead,  no shower yet. Use her incentive  spirometer at home at least five times a  day. Empty the drains three times a day and record the amount. Percocet,  total of #40 given, one p.o. q.4h. or two p.o. q.6h. p.r.n. pain. Cleocin  300 mg p.o. q.i.d. for 2 more days. We will see her back in the office in a  week or sooner if the drains slow further.      Etter Sjogren, M.D.  Electronically Signed     DB/MEDQ  D:  06/23/2005  T:  06/23/2005  Job:  [id number]

## 2011-01-24 NOTE — Op Note (Signed)
NAMESELETA, HOVLAND NO.:  [id number]   MEDICAL RECORD NO.:  [id number]          PATIENT TYPE:  INP   LOCATION:  5704                         FACILITY:  [facility]   PHYSICIAN:  Etter Sjogren, M.D.     DATE OF BIRTH:  02-Feb-1956   DATE OF PROCEDURE:  06/20/2005  DATE OF DISCHARGE:                                 OPERATIVE REPORT   PREOPERATIVE DIAGNOSES:  Bilateral breast cancer.   POSTOPERATIVE DIAGNOSES:  Bilateral breast cancer.   OPERATION PERFORMED:  1.  Right breast removal of tissue expander placed implant.  2.  Ipsilateral transverse right stomal myocutaneous flap for reconstruction      of the left breast.   SURGEON:  Etter Sjogren, M.D.   ASSISTANT:  Pleas Patricia, M.D.   ANESTHESIA:  General.   ESTIMATED BLOOD LOSS:  The estimated blood loss was 175 mL.   DRAINS:  One Harrison Mons for each side of the chest and two for the abdomen.   CLINICAL NOTE:  This is a 55 year old woman who has had breast cancer.  She  has had failure of lumpectomy and radiation, and subsequently underwent  completion mastectomy; and, she has had mastectomy on the opposite side as  well.  The right side was reconstructed with a tissue expander.  Transplant  was delayed for the left side.  The TRAM flap was not large enough to  reconstruct both breasts and her only options really were to use two  different techniques.  She needed to have autogenous tissue for the left  side and she preferred the TRAM flap over the latissimus.  The right side  reconstruction was with a tissue expander followed by placement of an  implant.  The patient understood that she would have asymmetry and she also  understood the other risks, possible complications and overall  convalescence, and wished to proceed.   DESCRIPTION OF OPERATION:  The patient was brought to the operating room and  was placed supine.  After the successful induction of general anesthesia she  was prepped with Betadine and  draped with sterile drapes.  The right side  was approached.   The incision was made at the old mastectomy scar centrally and thus this was  carried down through the underlying muscle.  The underlying tissue expander  was gently removed.  The pocket looked very healthy.  Three-0 Vicryl sutures  were preplaced for the closure of the capsule and muscle, simple interrupted  sutures, but left untied.   After thoroughly cleaning the gloves the implant was prepared.  This was a  McGhan, style 163, 440-460 mL maximum-filled 460 mL, BioDimensional implant,  lot number [id number].  The implant was soaked antibiotic solution and  antibiotic solution was also placed in the breast pocket and allowed to  dwell there.  The implant was prepared, placed in 100 mL of sterile saline  using the closed filling system and removing all the air.  The implant was  returned to the antibiotic solution again.  Then a Blake drain was  positioned, brought through a separate stab wound inferolaterally and  secured  with 3-0 plain suture.   After confirming excellent hemostasis the implant was positioned in the  right chest and was filled to maximum of 460 mL.  The wound was irrigated  with antibiotic solution and the 3-0 Vicryl sutures tied securely.  Three-0  Monocryl interrupted inverted deep sutures and 3-0 Monocryl running  subcuticular suture for the remainder of the closure.   Attention was then directed to the left chest.  The old scar was excised,  and the superior and inferior mastectomy flaps were then redeveloped right  off the underlying muscle.  This having been done to the preoperative  markings a moist lap was placed and attention was directed to the abdomen.   An incision was made around the umbilicus and fat pad was left with the  umbilicus to insure its survival.  The upper limb of the planned incision  was then incised and the dissection was carried down to the underlying  muscular fascia, beveling  in a cephalad direction in order to insure capture  of as many perforated vessels as possible.  Care was taken to avoid damage  to the underlying fascia, thus, using a continuous cephalad direction up to  the xiphoid area where a subcutaneous tunnel was created to the left chest  defect.  Meticulous hemostasis was with the electrocautery.  The lower limb  of the incision was then made and the flap was lifted from the right side  over to the midline and from the left side over to the lateral perforating  vessels.  A parallel incision was then made in the anterior rectus fascia  leaving a strip 2-2.5 cm wide on the anterior surface of the rectus muscle.  The fascial attachments were then gently dissected free from the underlying  muscle using a bipolar as a scalpel in the areas of adherent tendinous  inscriptions.  Having freed the muscle circumferentially the muscle was  divided inferiorly using the electrocautery.  The previously ligated deep  inferior gastric vessels were noted and the clips were in place.   The attention was directed to the left chest where a thorough irrigation  with saline was done and meticulous hemostasis was achieved with  electrocautery.  A Blake drain was positioned and brought through a separate  stab wound inferolaterally, and secured with a 3-0 Prolene suture.  This  TRAM flap was then passed onto the left chest through the subcutaneous  tunnel where it was temporarily placed through with skin staples.  It had  excellent color and bright red bleeding around its periphery consistent with  viability.  A tiny portion of zone 4 was amputated just beyond the  appendectomy scar there.  The remainder of the flap had bright red bleeding  and no further trimming was necessary.   After covering the flap with a dry towel to keep it warm, attention was  directed back to the abdomen.  Thorough irrigation with saline was done and hemostasis was confirmed.  The fascial  closure was done with 0-Prolene  interrupted figure-of-eight sutures taking care to avoid damage to  underlying intra-abdominal contents.  The abdomen was irrigated with saline  and then the Onlay mesh with the umbilicus brought through it was then  placed and secured around its periphery using 2-0 Prolene simple running  suture.  Again, thorough irrigation was done with saline.  The Blake drain  was positioned, brought through a separate stab incision inferiorly and the  pump was also positioned.  The pump was  secured with Steri-strips.  The  patient was then placed in a semi-Fowler's position and the skin of the  abdominal flap was redraped below.  The closure was with 2-0 Vicryl  interrupted deep sutures and 3-0 Monocryl interrupted inverted deep dermal  sutures followed by 4-0 Monocryl subcuticular suture.  An incision was made  at the level of the midline as close as possible given the fact that the  umbilicus had been pulled somewhat to the left.  The umbilicus was then set  with 3-0 Monocryl and 4-0 Monocryl interrupted inverted deep sutures with  the umbilicus having excellent color as did the entire abdominal skin flap.   Attention was directed back to the chest where the flap insetting was  performed.  The inferior border was placed end-to-end using 3-0 Monocryl  interrupted inverted deep sutures and the flap then marked for  deepithelialized, which was next performed.  The flap had excellent color  and bright red bleeding consistent with viability.  A few 3-0 Vicryl and 3-0  Monocryl horizontal mattress suspension sutures were placed in the superior  fascia, and the  remainder of closure was with 3-0 Monocryl interrupted inverted sutures and  4-0 Monocryl running subcuticular suture.  Steri-strips and a dry sterile  dressing were lightly applied.   The patient was transferred to the recovery room in stable condition having  tolerated the procedure well.      Etter Sjogren,  M.D.  Electronically Signed     DB/MEDQ  D:  06/20/2005  T:  06/21/2005  Job:  [id number]

## 2011-01-24 NOTE — H&P (Signed)
[facility]  Patient:    Tamara Webb, Tamara Webb Visit Number: [id number] MRN: [id number]          Service Type: Attending:  Leatha Gilding. Mezer, M.D. Dictated by:   Leatha Gilding. Mezer, M.D. Adm. Date:  08/10/01   CC:         Lowell C. Catha Gosselin, M.D.   History and Physical  ADMITTING DIAGNOSIS:  Pelvic mass.  HISTORY OF PRESENT ILLNESS:  The patient is a 55 year old gravida 2, SAB 1, ectopic 1, admitted with last menstrual period on July 23, 2001, with a pelvic mass on ultrasound, who is admitted for exploratory laparotomy with probable total abdominal hysterectomy and bilateral salpingo-oophorectomy and possible lymph node dissection and omentectomy by Rande Brunt. Clarke-Pearson, M.D.  The patient has been followed for a pelvic mass that has been increasing in size and is admitted for exploratory laparotomy.  The proposed surgery is a total abdominal hysterectomy, bilateral salpingo-oophorectomy, and if the mass is cancerous a possible lymph node dissection and possible omentectomy. Dr. De Blanch will be the assistant surgeon if the procedure is benign and routine and will become the primary surgeon at any time during the surgery where his expertise will be of benefit to the patient.  Potential complications of the procedure, including but not limited to anesthesia, injury to the bowel, bladder, the ureters, possible fistula formation, possible blood loss with transfusion and its sequelae, and possible infection, have been discussed with the patient in detail.  Serum pregnancy test was negative.  Endometrial biopsy in October 2002 was benign proliferative endometrium, no hyperplasia or malignancy identified, and a CA-125 was returned as 13.4 with a normal being 0-21 on July 07, 2001.  Postoperative expectations and restrictions have been reviewed with the patient in detail. The patient understands the permanence of sterilization that will occur  after hysterectomy.  PAST MEDICAL HISTORY:  History of breast cancer.  PAST SURGICAL HISTORY:  Appendectomy, right ovarian cystectomy, and tonsillectomy and adenoidectomy.  Laparoscopy and laparotomy for ectopic pregnancy.  Microscopic tuboplasty.  Breast lumpectomy.  Partial mastectomy and removal of lymph node from the arm.  MEDICATIONS:  Nolvadex and Wellbutrin.  ALLERGIES:  PENICILLIN.  FAMILY HISTORY:  Noncontributory.  SOCIAL HISTORY:  The patient is married with a supportive husband and is a smoker.  PHYSICAL EXAMINATION:  HEENT:  Negative.  CHEST:  The lungs are clear.  CARDIAC:  The heart is without murmurs.  BREASTS:  Without masses or discharge.  ABDOMEN:  Soft and nontender.  PELVIC:  BUS, vagina and cervix normal.  The uterus is anterior, normal in size.  The adnexa are thickened bilaterally.  RECTAL:  Negative.  EXTREMITIES:  Negative.  IMPRESSION:  Pelvic mass.  PLAN:  Exploratory laparotomy with probable total abdominal hysterectomy and bilateral salpingo-oophorectomy, question lymph node dissection and omentectomy if required. Dictated by:   Leatha Gilding. Mezer, M.D. Attending:  Leatha Gilding. Mezer, M.D. DD:  08/10/01 TD:  08/10/01 Job: [id number]

## 2011-01-31 ENCOUNTER — Encounter (HOSPITAL_BASED_OUTPATIENT_CLINIC_OR_DEPARTMENT_OTHER): Payer: Medicare Other | Admitting: Oncology

## 2011-01-31 ENCOUNTER — Other Ambulatory Visit: Payer: Self-pay | Admitting: Oncology

## 2011-01-31 DIAGNOSIS — Z9010 Acquired absence of unspecified breast and nipple: Secondary | ICD-10-CM

## 2011-01-31 DIAGNOSIS — C50319 Malignant neoplasm of lower-inner quadrant of unspecified female breast: Secondary | ICD-10-CM

## 2011-01-31 DIAGNOSIS — C50919 Malignant neoplasm of unspecified site of unspecified female breast: Secondary | ICD-10-CM

## 2011-01-31 DIAGNOSIS — Z171 Estrogen receptor negative status [ER-]: Secondary | ICD-10-CM

## 2011-02-05 NOTE — Op Note (Signed)
Tamara Webb, GEISELMAN NO.:  [id number]  MEDICAL RECORD NO.:  [id number]           PATIENT TYPE:  O  LOCATION:  [facility]SC                         FACILITY:  [facility]  PHYSICIAN:  Etter Sjogren, M.D.     DATE OF BIRTH:  09-12-1955  DATE OF PROCEDURE:  01/23/2011 DATE OF DISCHARGE:  01/23/2011                              OPERATIVE REPORT   PREOPERATIVE DIAGNOSIS:  Mechanical complication of right breast implant in the patient with breast cancer.  POSTOPERATIVE DIAGNOSIS:  Mechanical complication of right breast implant in the patient with breast cancer.  PROCEDURES PERFORMED: 1. Removal of a ruptured breast implant, right side. 2. Replace saline implant, right side.  SURGEON:  Etter Sjogren, MD  ANESTHESIA:  General.  ESTIMATED BLOOD LOSS:  5 mL.  CLINICAL NOTE:  A 55 year old woman who has had breast cancer, has had reconstruction week ago.  She noticed some deflation of the right breast implant where she had a tissue expander followed later by placement with saline implant.  She was scheduled for exchange of this implant.  The nature of procedure risks plus complications were discussed with her in great detail including but not limited to bleeding, infection, anesthesia-related complications, healing problems, scarring, fluid accumulations, loss of sensation, chronic pain, she already has chronic pain syndrome that could be exacerbated by this, pulmonary embolism, pneumothorax, asymmetry, deformity, disappointment and wrinkles, ripples, capsular contracture, failure of device, displacement of device and she understood all of this and wished to proceed.  It was felt that she would be better served by smooth-walled implant.  It was decided that she would transition to a 425 mL saline implant in order to get away from the textured surface that she had previously.  DESCRIPTION OF PROCEDURE:  The patient was marked in the holding area and taken to the operating  room and placed supine.  After sufficient general anesthesia, she was prepped with ChloraPrep.  After waiting full 3 minutes for drying, she was draped with sterile drapes.  A small incision was made at the lateral aspect of the mastectomy scar, approximately 3 cm in length.  The dissection was carried down through the subcutaneous tissue and muscle using electrocautery.  The underlying rectus implant was removed.  The space was inspected, found to be in excellent condition.  Thorough irrigation with saline, antibiotic solution was also placed and allowed to dwell there.  After thoroughly cleaning gloves, the implant was prepared.  This was an Inamed implant, 425 mL saline was soaked antibiotic solution.  After thoroughly cleaning gloves, 100 mL sterile saline placed using a closed filling system and was returned to the antibiotic solution and then antibiotic solution placed in the wound and then the implant was positioned and then filled to 425 mL using the closed filling system sterile saline.  Prior to filling the implant, 3-0 Vicryl sutures were preplaced, left untied. These were interrupted simple sutures and then once the implant had been filled, the sutures were tied securely after first placing more antibiotic solution and then the wound was closed with 3-0 Monocryl inverted deep dermal suture and running 3-0 Monocryl subcuticular suture.  A 0.25% Marcaine with epinephrine was placed along the periphery of the wound for local anesthetic and she was placed in the chest vest and transferred to the recovery room in stable having tolerated the procedure well.  DISPOSITION:  She will be discharged this evening and return to the office tomorrow for recheck.     Etter Sjogren, M.D.     DB/MEDQ  D:  01/23/2011  T:  01/24/2011  Job:  [id number]  Electronically Signed by Etter Sjogren M.D. on 02/05/2011 07:49:31 AM

## 2011-02-10 ENCOUNTER — Encounter (HOSPITAL_COMMUNITY): Payer: Self-pay

## 2011-02-10 ENCOUNTER — Encounter (HOSPITAL_COMMUNITY)
Admission: RE | Admit: 2011-02-10 | Discharge: 2011-02-10 | Disposition: A | Discharge status: Home / Self Care | Payer: Medicare Other | Source: Ambulatory Visit | Attending: Oncology | Admitting: Oncology

## 2011-02-10 DIAGNOSIS — C50919 Malignant neoplasm of unspecified site of unspecified female breast: Secondary | ICD-10-CM | POA: Insufficient documentation

## 2011-02-10 HISTORY — DX: Malignant (primary) neoplasm, unspecified: C80.1

## 2011-02-10 MED ORDER — TECHNETIUM TC 99M MEDRONATE IV KIT
22.8000 | PACK | Freq: Once | INTRAVENOUS | Status: AC | PRN
  Administered 2011-02-10: 22.8 via INTRAVENOUS

## 2011-08-29 ENCOUNTER — Ambulatory Visit: Payer: Medicare Other

## 2012-03-23 ENCOUNTER — Other Ambulatory Visit (HOSPITAL_BASED_OUTPATIENT_CLINIC_OR_DEPARTMENT_OTHER): Payer: Medicare Other | Admitting: Lab

## 2012-03-23 DIAGNOSIS — Z171 Estrogen receptor negative status [ER-]: Secondary | ICD-10-CM

## 2012-03-23 DIAGNOSIS — M810 Age-related osteoporosis without current pathological fracture: Secondary | ICD-10-CM

## 2012-03-23 DIAGNOSIS — C50319 Malignant neoplasm of lower-inner quadrant of unspecified female breast: Secondary | ICD-10-CM

## 2012-03-23 DIAGNOSIS — N390 Urinary tract infection, site not specified: Secondary | ICD-10-CM

## 2012-03-23 LAB — CBC WITH DIFFERENTIAL/PLATELET
Basophils Absolute: 0.1 10*3/uL (ref 0.0–0.1)
Eosinophils Absolute: 0.3 10*3/uL (ref 0.0–0.5)
HGB: 14.6 g/dL (ref 11.6–15.9)
LYMPH%: 16.2 % (ref 14.0–49.7)
MCV: 86.7 fL (ref 79.5–101.0)
MONO%: 3.7 % (ref 0.0–14.0)
NEUT#: 9.2 10*3/uL — ABNORMAL HIGH (ref 1.5–6.5)
NEUT%: 76.7 % (ref 38.4–76.8)
Platelets: 257 10*3/uL (ref 145–400)

## 2012-03-24 LAB — COMPREHENSIVE METABOLIC PANEL
Albumin: 4.3 g/dL (ref 3.5–5.2)
Alkaline Phosphatase: 109 U/L (ref 39–117)
BUN: 7 mg/dL (ref 6–23)
Creatinine, Ser: 0.63 mg/dL (ref 0.50–1.10)
Glucose, Bld: 119 mg/dL — ABNORMAL HIGH (ref 70–99)
Total Bilirubin: 0.5 mg/dL (ref 0.3–1.2)

## 2012-03-24 LAB — VITAMIN D 25 HYDROXY (VIT D DEFICIENCY, FRACTURES): Vit D, 25-Hydroxy: 74 ng/mL (ref 30–89)

## 2012-03-30 ENCOUNTER — Telehealth: Payer: Self-pay | Admitting: Oncology

## 2012-03-30 ENCOUNTER — Ambulatory Visit (HOSPITAL_BASED_OUTPATIENT_CLINIC_OR_DEPARTMENT_OTHER): Payer: Medicare Other | Admitting: Oncology

## 2012-03-30 VITALS — BP 144/84 | HR 102 | Temp 97.9°F | Ht 65.0 in | Wt 136.6 lb

## 2012-03-30 DIAGNOSIS — C50919 Malignant neoplasm of unspecified site of unspecified female breast: Secondary | ICD-10-CM

## 2012-03-30 DIAGNOSIS — C50319 Malignant neoplasm of lower-inner quadrant of unspecified female breast: Secondary | ICD-10-CM

## 2012-03-30 NOTE — Telephone Encounter (Signed)
gve the pt her July 2014 appt calendar along with the mammo appt. Pt is aware that i will contact her with the colonoscopy appt

## 2012-03-30 NOTE — Progress Notes (Signed)
Hematology and Oncology Follow Up Visit  Tamara Webb [id number] 1956-04-17 56 y.o. 03/30/2012 2:40 PM   DIAGNOSIS:   No diagnosis found.   PAST THERAPY:  1. Breast cancer diagnosed in 1999, status post AC chemotherapy x4 followed by 5 years of adjuvant hormonal therapy.  Recurrent disease, right breast, status post 4 cycles TC chemotherapy completed August 2006 for estrogen receptor, progesterone receptor-negative disease.  Status post bilateral reconstruction with transverse rectus abdominis myocutaneous flap on the left.  2. History of chronic pain syndrome., Being managed by neurology, currently on OxyIR, opana Flexeril and gabapentin.  3.   Interim History: No intercurrent illnesses. Pain overall is under fairly good control. She has not had any imaging on her flap TRAM flap on the left side in recent history. She apparently has not had a colonoscopy since a turning 52 and she has a sister that his younger that had 1 and was found to have 6 polyps.  Medications: I have reviewed the patient's current medications.  Allergies:  Allergies  Allergen Reactions  . Ivp Dye (Iodinated Diagnostic Agents)   . Penicillins     Past Medical History, Surgical history, Social history, and Family History were reviewed and updated.  Review of Systems: Constitutional:  Negative for fever, chills, night sweats, anorexia, weight loss, pain. Cardiovascular: no chest pain or dyspnea on exertion Respiratory: no cough, shortness of breath, or wheezing Neurological: negative Dermatological: negative ENT: negative Skin Gastrointestinal: negative Genito-Urinary: negative Hematological and Lymphatic: negative Breast: negative Musculoskeletal: negative Remaining ROS negative.  Physical Exam:  Blood pressure 144/84, pulse 102, temperature 97.9 F (36.6 C), height 5\' 5"  (1.651 m), weight 136 lb 9.6 oz (61.961 kg).  ECOG: 0 HEENT:  Sclerae anicteric, conjunctivae pink.  Oropharynx clear.  No  mucositis or candidiasis.  Nodes:  No cervical, supraclavicular, or axillary lymphadenopathy palpated.  Breast Exam:  Right breast is status post implant, I cannot appreciate any masses , the left might his flap is unremarkable. I cannot appreciate any masses. Both axilla are negative..  Lungs:  Clear to auscultation bilaterally.  No crackles, rhonchi, or wheezes.  Heart:  Regular rate and rhythm.  Abdomen:  Soft, nontender.  Positive bowel sounds.  No organomegaly or masses palpated.  Musculoskeletal:  No focal spinal tenderness to palpation.  Extremities:  Benign.  No peripheral edema or cyanosis.  Skin:  Benign.  Neuro:  Nonfocal.      Lab Results: Lab Results  Component Value Date   WBC 12.0* 03/23/2012   HGB 14.6 03/23/2012   HCT 43.9 03/23/2012   MCV 86.7 03/23/2012   PLT 257 03/23/2012     Chemistry      Component Value Date/Time   NA 138 03/23/2012 1410   K 4.0 03/23/2012 1410   CL 101 03/23/2012 1410   CO2 28 03/23/2012 1410   BUN 7 03/23/2012 1410   CREATININE 0.63 03/23/2012 1410      Component Value Date/Time   CALCIUM 9.7 03/23/2012 1410   ALKPHOS 109 03/23/2012 1410   AST 20 03/23/2012 1410   ALT 15 03/23/2012 1410   BILITOT 0.5 03/23/2012 1410       Radiological Studies:  No results found.   IMPRESSIONS AND PLAN: A 56 y.o. female with      history of recurrent breast cancer now status post bilateral mastectomies. She is 28 the second occurrence of breast cancer. She has been genetically tested and was negative. I have scheduled her for a mammogram of the left  reconstructed breast and a colonoscopy and I will see her in a years time .Spent more than half the time coordinating care, as well as discussion of BMI and its implications.      Porfirio Bollier 7/23/20132:40 PM Cell [id number]

## 2012-03-31 ENCOUNTER — Encounter: Payer: Self-pay | Admitting: Gastroenterology

## 2012-04-15 ENCOUNTER — Ambulatory Visit
Admission: RE | Admit: 2012-04-15 | Discharge: 2012-04-15 | Disposition: A | Discharge status: Home / Self Care | Payer: Medicare Other | Source: Ambulatory Visit | Attending: Oncology | Admitting: Oncology

## 2012-04-15 DIAGNOSIS — C50919 Malignant neoplasm of unspecified site of unspecified female breast: Secondary | ICD-10-CM

## 2012-05-17 ENCOUNTER — Encounter: Payer: Medicare Other | Admitting: Gastroenterology

## 2012-06-07 ENCOUNTER — Ambulatory Visit (AMBULATORY_SURGERY_CENTER): Payer: Medicare Other | Admitting: *Deleted

## 2012-06-07 VITALS — Ht 64.0 in | Wt 137.7 lb

## 2012-06-07 DIAGNOSIS — Z1211 Encounter for screening for malignant neoplasm of colon: Secondary | ICD-10-CM

## 2012-06-07 MED ORDER — MOVIPREP 100 G PO SOLR: ORAL | Status: DC

## 2012-06-21 ENCOUNTER — Ambulatory Visit (AMBULATORY_SURGERY_CENTER): Payer: Medicare Other | Admitting: Gastroenterology

## 2012-06-21 ENCOUNTER — Encounter: Payer: Self-pay | Admitting: Gastroenterology

## 2012-06-21 VITALS — BP 170/86 | HR 80 | Temp 97.8°F | Resp 14 | Ht 64.0 in | Wt 137.0 lb

## 2012-06-21 DIAGNOSIS — D126 Benign neoplasm of colon, unspecified: Secondary | ICD-10-CM

## 2012-06-21 DIAGNOSIS — Z1211 Encounter for screening for malignant neoplasm of colon: Secondary | ICD-10-CM

## 2012-06-21 MED ORDER — SODIUM CHLORIDE 0.9 % IV SOLN: 500.0000 mL | INTRAVENOUS | Status: DC

## 2012-06-21 NOTE — Op Note (Signed)
[facility] [address] [place], [address]   COLONOSCOPY PROCEDURE REPORT  PATIENT: Juvia, Aerts.  MR#: [id number] BIRTHDATE: [birth year] , 56  yrs. old GENDER: Female ENDOSCOPIST: Mardella Layman, MD, Clementeen Graham REFERRED BY:  Pierce Crane, M.D. PROCEDURE DATE:  06/21/2012 PROCEDURE:   Colonoscopy with snare polypectomy ASA CLASS:   Class III INDICATIONS:patient's family history of colon polyps. MEDICATIONS: Propofol (Diprivan) 550 mg IV  DESCRIPTION OF PROCEDURE:   After the risks and benefits and of the procedure were explained, informed consent was obtained.  A digital rectal exam revealed no abnormalities of the rectum.    The LB [id number]-[id number]  endoscope was introduced through the anus and advanced to the cecum, which was identified by both the appendix and ileocecal valve .  The quality of the prep was adequate. .  The instrument was then slowly withdrawn as the colon was fully examined.     COLON FINDINGS: The colon was redundant.   A smooth flat polyp ranging between 5-29mm in size was found at the cecum.  A polypectomy was performed using snare cautery.This is jar #1. A large 3 cm. polypoid mass at cecal base removed with hot snare in piecemeal fashion..jar #@.,see pictures.  The resection was complete and the polyp tissue was completely retrieved. Another sigmoid 7mm polyp hot sanre removed...jar #3. Very difficult exam here per lots of fluid in colon and marked tortuosity.          The scope was then withdrawn from the patient and the procedure completed.  COMPLICATIONS: There were no complications. ENDOSCOPIC IMPRESSION: 1.   The colon was redundant  with difficult exam! 2.   Flat polyp ranging between 5-9mm in size was found at the cecum; polypectomy was performed using snare cautery 3.   Large cecal polyp removed in piecemeal manor...r/o carcinoma. 4.  Benign sigmoid polyp removed.Marland KitchenMarland Kitchen?? HNPCC SYNDROME HERE PER HER FAMILY HISTORY AND  PERSONAL HX. OF BREAST CANCER. RECOMMENDATIONS: 1.  await biopsy results 2.  continue current medications 3.  Hold aspirin, aspirin products, and anti-inflammatory medication for 2 weeks. 4.. Consider genetic referral 5.  F?U 1 year REPEAT EXAM:  cc:  _______________________________ eSignedMardella Layman, MD, [facility] 06/21/2012 12:09 PM     PATIENT NAME:  Jenell Milliner. MR#: [id number]

## 2012-06-21 NOTE — Patient Instructions (Addendum)
YOU HAD AN ENDOSCOPIC PROCEDURE TODAY AT THE [facility]: Refer to the procedure report that was given to you for any specific questions about what was found during the examination.  If the procedure report does not answer your questions, please call your gastroenterologist to clarify.  If you requested that your care partner not be given the details of your procedure findings, then the procedure report has been included in a sealed envelope for you to review at your convenience later.  YOU SHOULD EXPECT: Some feelings of bloating in the abdomen. Passage of more gas than usual.  Walking can help get rid of the air that was put into your GI tract during the procedure and reduce the bloating. If you had a lower endoscopy (such as a colonoscopy or flexible sigmoidoscopy) you may notice spotting of blood in your stool or on the toilet paper. If you underwent a bowel prep for your procedure, then you may not have a normal bowel movement for a few days.  DIET: Your first meal following the procedure should be a light meal and then it is ok to progress to your normal diet.  A half-sandwich or bowl of soup is an example of a good first meal.  Heavy or fried foods are harder to digest and may make you feel nauseous or bloated.  Likewise meals heavy in dairy and vegetables can cause extra gas to form and this can also increase the bloating.  Drink plenty of fluids but you should avoid alcoholic beverages for 24 hours.  ACTIVITY: Your care partner should take you home directly after the procedure.  You should plan to take it easy, moving slowly for the rest of the day.  You can resume normal activity the day after the procedure however you should NOT DRIVE or use heavy machinery for 24 hours (because of the sedation medicines used during the test).    SYMPTOMS TO REPORT IMMEDIATELY: A gastroenterologist can be reached at any hour.  During normal business hours, 8:30 AM to 5:00 PM Monday through Friday,  call [phone].  After hours and on weekends, please call the GI answering service at [phone] who will take a message and have the physician on call contact you.   Following lower endoscopy (colonoscopy or flexible sigmoidoscopy):  Excessive amounts of blood in the stool  Significant tenderness or worsening of abdominal pains  Swelling of the abdomen that is new, acute  Fever of 100F or higher  Following upper endoscopy (EGD)  Vomiting of blood or coffee ground material  New chest pain or pain under the shoulder blades  Painful or persistently difficult swallowing  New shortness of breath  Fever of 100F or higher  Black, tarry-looking stools  FOLLOW UP: If any biopsies were taken you will be contacted by phone or by letter within the next 1-3 weeks.  Call your gastroenterologist if you have not heard about the biopsies in 3 weeks.  Our staff will call the home number listed on your records the next business day following your procedure to check on you and address any questions or concerns that you may have at that time regarding the information given to you following your procedure. This is a courtesy call and so if there is no answer at the home number and we have not heard from you through the emergency physician on call, we will assume that you have returned to your regular daily activities without incident.  SIGNATURES/CONFIDENTIALITY: You and/or your care   partner have signed paperwork which will be entered into your electronic medical record.  These signatures attest to the fact that that the information above on your After Visit Summary has been reviewed and is understood.  Full responsibility of the confidentiality of this discharge information lies with you and/or your care-partner.  

## 2012-06-21 NOTE — Progress Notes (Signed)
Pt. Has had bilateral mastectomy.  States she has bilateral neuropathy in her feet.  Pt stated that she would prefer that we start IV in Arm vs. Foot.  Dr. Jarold Motto stated that IV could be started in Right arm.  Information related to Jennye Boroughs RN and from [place] [facility] Ardeen Jourdain RN.

## 2012-06-21 NOTE — Progress Notes (Signed)
Patient did not experience any of the following events: a burn prior to discharge; a fall within the facility; wrong site/side/patient/procedure/implant event; or a hospital transfer or hospital admission upon discharge from the facility. (G8907) Patient did not have preoperative order for IV antibiotic SSI prophylaxis. (G8918)  

## 2012-06-22 ENCOUNTER — Telehealth: Payer: Self-pay | Admitting: *Deleted

## 2012-06-22 NOTE — Telephone Encounter (Signed)
  Follow up Call-  Call back number 06/21/2012  Post procedure Call Back phone  # [phone]    Permission to leave phone message Yes     Patient questions:  Do you have a fever, pain , or abdominal swelling? no Pain Score  0 *  Have you tolerated food without any problems? yes  Have you been able to return to your normal activities? yes  Do you have any questions about your discharge instructions: Diet   no Medications  no Follow up visit  no  Do you have questions or concerns about your Care? no  Actions: * If pain score is 4 or above: No action needed, pain <4.  Information provided by husband. Husband had question about tissue removed from colon. Explained to ,him that the polyp is removed and then sent to lab for examination and once the results come in we will send a letter out letting them know what findings are,husband verbalize understanding.

## 2012-06-25 ENCOUNTER — Telehealth: Payer: Self-pay | Admitting: Gastroenterology

## 2012-06-25 ENCOUNTER — Encounter: Payer: Self-pay | Admitting: Gastroenterology

## 2012-06-25 DIAGNOSIS — Z860100 Personal history of colon polyps, unspecified: Secondary | ICD-10-CM

## 2012-06-25 DIAGNOSIS — Z83719 Family history of colon polyps, unspecified: Secondary | ICD-10-CM

## 2012-06-25 DIAGNOSIS — Z8371 Family history of colonic polyps: Secondary | ICD-10-CM

## 2012-06-25 NOTE — Telephone Encounter (Signed)
This patient needs referral to the oncology Center geneticist for HNPCC screening.

## 2012-06-25 NOTE — Addendum Note (Signed)
Addended by: Florene Glen on: 06/25/2012 04:54 PM   Modules accepted: Orders

## 2012-06-25 NOTE — Telephone Encounter (Signed)
Referral to Genetics made.

## 2012-11-27 ENCOUNTER — Telehealth: Payer: Self-pay | Admitting: Oncology

## 2012-11-27 NOTE — Telephone Encounter (Signed)
LMONVM ADVISING THE PT OF HER RE-ASSIGNMENT FROM DR RUBIN TO DR Darnelle Catalan. PT WILL RECEIVE AN APPT CALENDAR WITH  A LETTER TO CONFIRM THE APPT CHANGES.

## 2012-12-04 ENCOUNTER — Encounter: Payer: Self-pay | Admitting: Oncology

## 2012-12-08 ENCOUNTER — Other Ambulatory Visit: Payer: Self-pay

## 2012-12-08 MED ORDER — OXYCODONE HCL 10 MG PO TABS: ORAL_TABLET | ORAL | Status: DC

## 2012-12-08 NOTE — Telephone Encounter (Signed)
Patient calling to request refill on Oxycodone.  Would like rx mailed to her home address.

## 2012-12-31 ENCOUNTER — Telehealth: Payer: Self-pay

## 2012-12-31 NOTE — Telephone Encounter (Signed)
Patient called to request her monthly Rx for Oxycodone 10mg  #180.  This Rx was last written on 04/02.  It is too soon to prescribe.  I called the patient back and spoke with her.  She is aware Rx is too soon.  Will call back for refill when it's due.

## 2013-01-05 ENCOUNTER — Other Ambulatory Visit: Payer: Self-pay

## 2013-01-05 MED ORDER — OXYCODONE HCL 10 MG PO TABS: ORAL_TABLET | ORAL | Status: DC

## 2013-01-05 NOTE — Telephone Encounter (Signed)
Patient called requesting refills on Oxycodone.  She would like the rx mailed to her when ready.

## 2013-02-04 ENCOUNTER — Other Ambulatory Visit: Payer: Self-pay

## 2013-02-04 MED ORDER — OXYCODONE HCL 10 MG PO TABS: ORAL_TABLET | ORAL | Status: DC

## 2013-02-04 NOTE — Telephone Encounter (Signed)
Patient is requesting a refill on Oxycodone.  She would like to either pick it up or have it mailed.  Call back number is [phone]

## 2013-02-08 ENCOUNTER — Encounter: Payer: Self-pay | Admitting: Neurology

## 2013-02-08 ENCOUNTER — Ambulatory Visit (INDEPENDENT_AMBULATORY_CARE_PROVIDER_SITE_OTHER): Payer: Medicare Other | Admitting: Neurology

## 2013-02-08 VITALS — BP 135/86 | HR 99 | Temp 99.1°F | Ht 64.5 in | Wt 133.0 lb

## 2013-02-08 DIAGNOSIS — C50919 Malignant neoplasm of unspecified site of unspecified female breast: Secondary | ICD-10-CM | POA: Insufficient documentation

## 2013-02-08 DIAGNOSIS — G620 Drug-induced polyneuropathy: Secondary | ICD-10-CM | POA: Insufficient documentation

## 2013-02-08 DIAGNOSIS — M542 Cervicalgia: Secondary | ICD-10-CM

## 2013-02-08 DIAGNOSIS — T451X5A Adverse effect of antineoplastic and immunosuppressive drugs, initial encounter: Secondary | ICD-10-CM

## 2013-02-08 LAB — COMPREHENSIVE METABOLIC PANEL
ALT: 18 IU/L (ref 0–32)
Albumin: 4.6 g/dL (ref 3.5–5.5)
BUN: 6 mg/dL (ref 6–24)
CO2: 28 mmol/L (ref 19–28)
Chloride: 97 mmol/L (ref 96–108)
GFR calc Af Amer: 120 mL/min/{1.73_m2} (ref >59)
Glucose: 101 mg/dL — ABNORMAL HIGH (ref 65–99)
Potassium: 4.3 mmol/L (ref 3.5–5.2)
Total Bilirubin: 0.3 mg/dL (ref 0.0–1.2)
Total Protein: 7 g/dL (ref 6.0–8.5)

## 2013-02-08 LAB — CBC
HCT: 44.4 % (ref 34.0–46.6)
Platelets: 272 10*3/uL (ref 155–379)
RBC: 5.11 x10E6/uL (ref 3.77–5.28)
RDW: 13.2 % (ref 12.3–15.4)
WBC: 11 10*3/uL — ABNORMAL HIGH (ref 3.4–10.8)

## 2013-02-08 MED ORDER — GABAPENTIN 600 MG PO TABS: 600.0000 mg | ORAL_TABLET | Freq: Three times a day (TID) | ORAL | Status: DC

## 2013-02-08 MED ORDER — OXYCODONE HCL 10 MG PO TABS: ORAL_TABLET | ORAL | Status: DC

## 2013-02-08 MED ORDER — CYCLOBENZAPRINE HCL 10 MG PO TABS: 10.0000 mg | ORAL_TABLET | Freq: Every day | ORAL | Status: DC | PRN

## 2013-02-08 NOTE — Assessment & Plan Note (Signed)
Following chemotherapy in 1999 and 2006 after breast cancer and recurrence. Dr Donnie Coffin

## 2013-02-08 NOTE — Progress Notes (Signed)
[facility]  Provider:  Dr Tresten Pantoja Referring Provider: Dr. Donnie Coffin, Oncologist. Primary Care Physician:  Thane Edu, MD  Chief Complaint  Patient presents with  . Follow-up    neuropathy,rm 11    HPI:  Tamara Webb is a 57 y.o. female here as a  Follow up, originally referral from Dr. Donnie Coffin.  Tamara Webb has a history of breast constant, she was diagnosed with a non-metastatic disease first in your left and right in the left breast in  1999, followed  by bilateral mastectomy in 2006 . This was after the patient breast cancer  Recurred in  the left  Breast. She underwent chemotherapy and radiation, after 2006  only  chemotherapy was applied . The  patient was left with a painful neuropathy thought to be induced by  Chemotherapy. The patient has been followed here for the painful neuropathy and has been for many years treated with narcotic pain medications. The patient was referred to Dr. Chari Manning  pain clinic and he recommended OxyContin to combine with  a long acting medication( in combination with a shorter acting narcotic) for breakthrough pain. She has been using Opana  for the extended release 5 mg daily,  but states today that since last visit 06-11-12  last year she has weaned herself off. Gabapentin caused swelling in the ankles and she has to been tolerating only a dose  of  600 mg tid.  She is happy to stay with [facility]  rather than to go to Dr. Murray Hodgkins for pain therapy, as we have discussed.  She brought me two expired prescriptions for  Narcotics that she never needed to fill  (OPANA).               Review of Systems: Out of a complete 14 system review, the patient complains of only the following symptoms, and all other reviewed systems are negative.  pain full neuropathy , Lhermitte sign,  Sleepiness .  History   Social History  . Marital Status: Married    Spouse Name: N/A    Number of Children: N/A  . Years of Education: N/A    Occupational History  . Not on file.   Social History Main Topics  . Smoking status: Current Every Day Smoker -- 1.00 packs/day    Types: Cigarettes  . Smokeless tobacco: Never Used     Comment: quit for 20 years,just started back in 2000  . Alcohol Use: No  . Drug Use: No  . Sexually Active: Not on file   Other Topics Concern  . Not on file   Social History Narrative  . No narrative on file    Family History  Problem Relation Age of Onset  . Colon polyps Sister 30  . Colon cancer Neg Hx     Past Medical History  Diagnosis Date  . Cancer 1999&2006    breast cancer x2   . Allergy   . Heart murmur 1999  . COPD (chronic obstructive pulmonary disease)   . Neuropathy   . Breast cancer     bilateral   . Neuropathy due to chemotherapeutic drug     breast cancer , Dr Donnie Coffin.     Past Surgical History  Procedure Laterality Date  . Total abdominal hysterectomy  2001  . Mastectomy  2006    bil.mastectomy with abdominal trans flap.  . Breast reconstruction    . Lymph gland excision      left arm  . Ectopic pregnancy surgery    .  Appendectomy    . Tonsillectomy  as child  . Ovary surgery      after ectopic sx  . Carpal tunnel release      right    Current Outpatient Prescriptions  Medication Sig Dispense Refill  . Cholecalciferol (VITAMIN D PO) Take 5,000 Units by mouth daily.      . cyclobenzaprine (FLEXERIL) 10 MG tablet Take 10 mg by mouth daily as needed.       . gabapentin (NEURONTIN) 600 MG tablet Take 600 mg by mouth 3 (three) times daily.       . Multiple Vitamin (MULTIVITAMIN) tablet Take 1 tablet by mouth daily.      . Oxycodone HCl 10 MG TABS One po q 4 to 6 hours po prn breakthrough pain  180 tablet  0  . oxymorphone (OPANA) 5 MG tablet Take 5 mg by mouth 2 (two) times daily. As needed       No current facility-administered medications for this visit.    Allergies as of 02/08/2013 - Review Complete 02/08/2013  Allergen Reaction Noted  .  Gadolinium derivatives Shortness Of Breath and Swelling 06/07/2012  . Penicillins Shortness Of Breath 03/30/2012  . Ivp dye (iodinated diagnostic agents)  03/30/2012  . Pepto-bismol (bismuth subsalicylate) Swelling 06/07/2012    Vitals: BP 135/86  Pulse 99  Temp(Src) 99.1 F (37.3 C) (Oral)  Ht 5' 4.5" (1.638 m)  Wt 133 lb (60.328 kg)  BMI 22.48 kg/m2 Last Weight:  Wt Readings from Last 1 Encounters:  02/08/13 133 lb (60.328 kg)   Last Height:   Ht Readings from Last 1 Encounters:  02/08/13 5' 4.5" (1.638 m)   Vision Screening: see vitals  Physical exam:  General: The patient is awake, alert and appears not in acute distress. The patient is well groomed. Head: Normocephalic, atraumatic. Neck is supple. Mallampati 1, neck circumference:14.5 inches, no nasal deviation  No retrognathia.  Cardiovascular:  Regular rate and rhythm, without  murmurs or carotid bruit, and without distended neck veins. Respiratory: Lungs are clear to auscultation. Patient has chronic wheezing, is an active smoker .  Skin:  Without evidence of edema, or rash, patient is pale.  Trunk: BMI is normal ,.  Neurologic exam : The patient is awake and alert, oriented to place and time.  Memory subjective described as intact. There is a normal attention span & concentration ability. Speech is fluent without  dysarthria, dysphonia or aphasia. Mood and affect are appropriate.  Cranial nerves: Pupils are equal and briskly reactive to light. Funduscopic exam without  evidence of pallor or edema.  Extraocular movements  in vertical and horizontal planes intact and without nystagmus. Visual fields by finger perimetry are intact. Hearing to finger rub intact.  Facial sensation intact to fine touch. Facial motor strength is symmetric and tongue and uvula move midline.  Motor exam:   Normal tone and normal muscle bulk and symmetric normal strength in all extremities.  Sensory:  Fine touch, pinprick and vibration were  tested in all extremities. Proprioception is tested in the upper extremities only. This was normal.  Coordination: Rapid alternating movements in the fingers/hands is tested and normal. Finger-to-nose maneuver tested and normal without evidence of ataxia, dysmetria or tremor.  Gait and station: Patient walks without assistive device . Strength within normal limits. Stance is stable and normal. Tandem gait is unfragmented. Romberg testing is positive for retropulsion.  Deep tendon reflexes: in the  upper and lower extremities are symmetric and intact. Babinski maneuver response  is downgoing.   Assessment:  After physical and neurologic examination, review of laboratory studies, imaging, neurophysiology testing and pre-existing records, assessment will be reviewed on the problem list.   Painful neuropathy status post chemotherapy for breast cancer with recurrence. First diagnosis 1999 left breast and 2006 recurrence in the left breast and positive biopsy of the right breast. Has been on disability and is considered unable to return to work. We'll check her next visit in 6 months. Today needs CBC result differential and metabolic panel.  Patient weaned herself off the Opana and stays on gabapentin and oxycodone as well as Flexeril when necessary.  Discussed smoking cessation :  I will try to find some additional literature / instruction that can help her with this  task.  Plan:  Treatment plan and additional workup will be reviewed under Problem List.

## 2013-02-08 NOTE — Patient Instructions (Signed)

## 2013-02-10 ENCOUNTER — Telehealth: Payer: Self-pay | Admitting: Oncology

## 2013-02-10 NOTE — Telephone Encounter (Signed)
C/D 02/10/13 for appt. 02/16/13

## 2013-03-22 ENCOUNTER — Other Ambulatory Visit: Payer: Self-pay

## 2013-03-22 DIAGNOSIS — G620 Drug-induced polyneuropathy: Secondary | ICD-10-CM

## 2013-03-22 MED ORDER — OXYCODONE HCL 10 MG PO TABS: ORAL_TABLET | ORAL | Status: DC

## 2013-03-22 NOTE — Telephone Encounter (Signed)
Patient called requesting a refill on Oxycodone.  She would like the Rx mailed to her when it's ready.  

## 2013-03-24 ENCOUNTER — Other Ambulatory Visit: Payer: Self-pay | Admitting: *Deleted

## 2013-03-24 DIAGNOSIS — C50919 Malignant neoplasm of unspecified site of unspecified female breast: Secondary | ICD-10-CM

## 2013-03-25 ENCOUNTER — Telehealth: Payer: Self-pay | Admitting: *Deleted

## 2013-03-25 ENCOUNTER — Other Ambulatory Visit: Payer: Medicare Other | Admitting: Lab

## 2013-03-25 NOTE — Telephone Encounter (Signed)
Pt called to cancel her lab appt for today , and to rs for the same day as her ov.gv lab appt for 03/31/13 @12 :15pm. Pt is aware

## 2013-03-29 ENCOUNTER — Other Ambulatory Visit: Payer: Self-pay | Admitting: Family

## 2013-03-30 ENCOUNTER — Telehealth: Payer: Self-pay | Admitting: *Deleted

## 2013-03-30 NOTE — Telephone Encounter (Signed)
Called pt to let her know that Annice Pih was not going to be in the office tomorrow due to a class and we needed to reschedule her.  Confirmed 04/07/13 appt w/ pt.  Informed Annice Pih that this was completed. Cancelled lab appt.

## 2013-03-31 ENCOUNTER — Ambulatory Visit: Payer: Medicare Other | Admitting: Family

## 2013-03-31 ENCOUNTER — Other Ambulatory Visit: Payer: Medicare Other | Admitting: Lab

## 2013-04-01 ENCOUNTER — Telehealth: Payer: Self-pay | Admitting: Oncology

## 2013-04-01 ENCOUNTER — Ambulatory Visit: Payer: Medicare Other | Admitting: Oncology

## 2013-04-01 NOTE — Telephone Encounter (Signed)
Due to [facility] out of office 7/24 appt needed to be r/s - info forwarded to [facility] as this is a PR transfer pt. appt already r/s'd by Misty Stanley.

## 2013-04-07 ENCOUNTER — Telehealth: Payer: Self-pay | Admitting: Oncology

## 2013-04-07 ENCOUNTER — Ambulatory Visit (HOSPITAL_BASED_OUTPATIENT_CLINIC_OR_DEPARTMENT_OTHER): Payer: Medicare Other | Admitting: Family

## 2013-04-07 ENCOUNTER — Encounter: Payer: Self-pay | Admitting: Family

## 2013-04-07 VITALS — BP 145/87 | HR 104 | Temp 98.3°F | Resp 20 | Ht 64.5 in | Wt 134.6 lb

## 2013-04-07 DIAGNOSIS — C50919 Malignant neoplasm of unspecified site of unspecified female breast: Secondary | ICD-10-CM

## 2013-04-07 DIAGNOSIS — C50912 Malignant neoplasm of unspecified site of left female breast: Secondary | ICD-10-CM

## 2013-04-07 NOTE — Progress Notes (Addendum)
[facility]  Telephone:([phone]) [phone] [phone]  OFFICE PROGRESS NOTE   ID: [place]   DOB: 05-23-1956  MR#: [id number]  JYN#:[id number]   PCP: Darrow Bussing, M.D. Janeece RiggersKendrick Ranch, M.D. (1999)/Christian Jamey Ripa, M.D. [phone]) & Etter Sjogren, M.D. (2006) RAD ONC: Jackelyn Knife, M.D. NEURO: Melvyn Novas, M.D.   HISTORY OF PRESENT ILLNESS: Mrs. Vollman herself states she felt a lump 2 years prior to her left breast cancer diagnosis in 1999.  She states she was waterskiing in 1997 and felt a "tugging" with a lump in her left breast.  Limited medical records are available from that era, so the course of her initial left breast invasive carcinoma is summarized.  She was treated with 4 cycles of neoadjuvant chemotherapy consisting of AC (Adriamycin/Cytoxan) completed in 10/1997.  She is status post left breast lumpectomy in 06/1998 for a 3 cm left breast invasive carcinoma, stage IIA, T2 N0, ER positive PR negative, with negative sentinel lymph nodes.  She is status post left breast radiation therapy that was completed in 10/1998.  This was followed by 5 years of adjuvant hormonal therapy with various antiestrogen agents.  She underwent total bilateral hysterectomy/bilateral salpingo-oophorectomy in 08/2001.    She experienced recurrent disease in the left breast in 11/2004.  She had a left breast needle core biopsy on 11/06/2004 which showed benign fibroadipose tissue consistent with scar.  She had another left breast needle core biopsy on 11/12/2004 which showed invasive mammary carcinoma.  She is status post left breast simple mastectomy and right breast simple mastectomy with left axillary sentinel node biopsy on 01/06/2005 for a left breast, stage I, pT1c, pN0(i-)(sn), pMX, 1.1 cm invasive ductal carcinoma with lobular features, grade 2, (right breast showed patchy fibrosis and ductal ectasia with no evidence of malignancy), estrogen receptor negative, progesterone receptor  negative, Ki-67 6%, HER-2/neu by FISH no amplification, with 0/1 metastatic left axillary lymph nodes.The patient began adjuvant chemotherapy with TC (Taxotere/Cytoxan) on 02/20/2005 until 04/24/2005 x 4 cycles with Neulasta support.  She underwent bilateral breast reconstructive surgery with TRAM flaps in 06/2005.  Her subsequent history is as detailed below.   INTERVAL HISTORY: Dr. Darnelle Catalan and I saw Mrs. Jenell Milliner today for followup of left breast invasive ductal carcinoma status post bilateral mastectomies.  She is accompanied for today's office visit by her husband Sloka Volante.  The patient was last seen by Dr. Donnie Coffin on 03/30/2012.  Since her last office visit, the patient has been doing relatively well.  She is establishing herself with Dr. Darrall Dears service today.   REVIEW OF SYSTEMS: A 10 point review of systems was completed and is negative except ongoing chronic pain that is debilitating.  The patient is a current everyday smoker and smoking cessation was discussed during this office visit. She denies any other symptomatology.   PAST MEDICAL HISTORY: Past Medical History  Diagnosis Date  . Cancer 1999&2006    breast cancer x2   . Allergy   . Heart murmur 1999  . COPD (chronic obstructive pulmonary disease)   . Neuropathy   . Breast cancer     bilateral   . Neuropathy due to chemotherapeutic drug     breast cancer , Dr Donnie Coffin.     PAST SURGICAL HISTORY: Past Surgical History  Procedure Laterality Date  . Total abdominal hysterectomy  2001  . Mastectomy  2006    bil.mastectomy with abdominal trans flap.  . Breast reconstruction    . Lymph gland excision  left arm  . Ectopic pregnancy surgery    . Appendectomy    . Tonsillectomy  as child  . Ovary surgery      after ectopic sx  . Carpal tunnel release      right    FAMILY HISTORY Family History  Problem Relation Age of Onset  . Colon polyps Sister 73  . Diabetes Sister   . Colon cancer Neg Hx   .  Diabetes Mother   . Multiple sclerosis Mother   . Heart attack Mother   . Stroke Mother   . Diabetes Father   . Heart attack Father   . Cancer Father     Kidney cancer    GYNECOLOGIC HISTORY: Gravida 2, para 0, 1 miscarriage, 1 tubal pregnancy, age of menarche 57, menopause at the time of hysterectomy in 2002.  Used birth control pills from ages 57 through 35.   SOCIAL HISTORY: Ms. is patent has been married to her husband Anyelin Mogle since 57.  Both Mr. and Mrs. Stege are on disability.  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  In her spare time she enjoys doing yard work, watching TV, reading, and going to the gun range for target practice.    ADVANCED DIRECTIVES: Not on file   HEALTH MAINTENANCE: History  Substance Use Topics  . Smoking status: Current Every Day Smoker -- 1.00 packs/day    Types: Cigarettes  . Smokeless tobacco: Never Used     Comment: quit for 20 years,just started back in 2000  . Alcohol Use: No    Colonoscopy: 06/21/2012 PAP: Not on file Bone density: The patient's last bone density scan on 01/15/2010 showed a T score of -1.4 (osteopenia).  Lipid panel: Not on file   Allergies  Allergen Reactions  . Gadolinium Derivatives Shortness Of Breath and Swelling    Given for xray  . Penicillins Shortness Of Breath  . Ivp Dye (Iodinated Diagnostic Agents)   . Pepto-Bismol (Bismuth Subsalicylate) Swelling    Current Outpatient Prescriptions  Medication Sig Dispense Refill  . Cholecalciferol (VITAMIN D PO) Take 5,000 Units by mouth daily.      . cyclobenzaprine (FLEXERIL) 10 MG tablet Take 1 tablet (10 mg total) by mouth daily as needed.  30 tablet  5  . gabapentin (NEURONTIN) 600 MG tablet Take 1 tablet (600 mg total) by mouth 3 (three) times daily.  270 tablet  3  . Multiple Vitamin (MULTIVITAMIN) tablet Take 1 tablet by mouth daily.      . Oxycodone HCl 10 MG TABS One po q 4 to 6 hours po prn breakthrough pain   180 tablet  0   No current facility-administered medications for this visit.    OBJECTIVE: Filed Vitals:   04/07/13 1400  BP: 145/87  Pulse: 104  Temp: 98.3 F (36.8 C)  Resp: 20     Body mass index is 22.76 kg/(m^2).      ECOG FS: 1 - Symptomatic but completely ambulatory  General appearance: Alert, cooperative, thin frame, no apparent distress Head: Normocephalic, without obvious abnormality, atraumatic, numerous dental caries and missing dentition Eyes: Conjunctivae/corneas clear, PERRLA, EOMI Nose: Nares, septum and mucosa are normal, no drainage or sinus tenderness Neck: No adenopathy, supple, symmetrical, trachea midline, no tenderness, limited cervical range of motion Resp: Clear to auscultation bilaterally Cardio: Regular rate and rhythm, S1, S2 normal, no murmur, click, rub or gallop Breasts: Bilateral mastectomies with bilateral reconstruction, well-healed reconstructed breasts surgical scars bilaterally, no ,  bilateral axillary fullness GI: Soft, not distended, non-tender, hypoactive bowel sounds, no organomegaly Extremities: Extremities normal, atraumatic, no cyanosis or edema, limited upper extremity range of motion Lymph nodes: Cervical, supraclavicular, and axillary nodes normal Neurologic: Grossly normal   LAB RESULTS: Lab Results  Component Value Date   WBC 11.0* 02/08/2013   NEUTROABS 9.2* 03/23/2012   HGB 15.2 02/08/2013   HCT 44.4 02/08/2013   MCV 87 02/08/2013   PLT 272 02/08/2013      Chemistry      Component Value Date/Time   NA 135 02/08/2013 1348   NA 138 03/23/2012 1410   K 4.3 02/08/2013 1348   CL 97 02/08/2013 1348   CO2 28 02/08/2013 1348   BUN 6 02/08/2013 1348   BUN 7 03/23/2012 1410   CREATININE 0.58 02/08/2013 1348      Component Value Date/Time   CALCIUM 10.1 02/08/2013 1348   ALKPHOS 108 02/08/2013 1348   AST 21 02/08/2013 1348   ALT 18 02/08/2013 1348   BILITOT 0.3 02/08/2013 1348      Lab Results  Component Value Date   LABCA2 35 03/23/2012     Urinalysis    Component Value Date/Time   LABSPEC 1.005 08/25/2006 1207    STUDIES: 1.  The patient's last bone density scan on 01/15/2010 showed a T score of -1.4 (osteopenia).   2.  The patient's last unilateral left screening mammogram on 04/15/2012 showed there are scattered fibroglandular densities.  No suspicious masses, architectural distortion, or calcifications are present.  There is a left implant.  No mammographic evidence of malignancy    ASSESSMENT: Ms. Tamara Webb is a  57 y.o.  [place], [place] woman: 1.  She was treated with 4 cycles of neoadjuvant chemotherapy consisting of AC (Adriamycin/Cytoxan) completed in 10/1997.  She is status post left breast lumpectomy in 06/1998 for a 3 cm left breast invasive carcinoma, stage IIA, T2 N0, ER positive,  PR negative, with negative sentinel lymph nodes.  She is status post left breast radiation therapy, completed in 10/1998.  Radiation therapy was followed by 5 years of adjuvant hormonal therapy with various antiestrogen agents.  She underwent total bilateral hysterectomy/bilateral salpingo-oophorectomy in 08/2001.  Recurrent disease in the left breast diagnosed in 11/2004.  2.  Status post left breast needle core biopsy on 11/06/2004 which showed benign fibroadipose tissue consistent with scar.  3.  Status post left breast needle core biopsy on 11/12/2004 which showed invasive mammary carcinoma.  4.  Status post left breast simple mastectomy and right breast simple mastectomy with left axillary sentinel node biopsy on 01/06/2005 for a left breast, stage I, pT1c, pN0(i-)(sn), pMX, 1.1 cm invasive ductal carcinoma with lobular features, grade 2, (right breast showed patchy fibrosis and ductal ectasia with no evidence of malignancy), estrogen receptor negative, progesterone receptor negative, Ki-67 6%, HER-2/neu by FISH no amplification, with 0/1 metastatic left axillary lymph nodes.  5.  The patient began adjuvant chemotherapy with  TC (Taxotere/Cytoxan) on 02/20/2005 until 04/24/2005 x 4 cycles with Neulasta support.  6.  Status post bilateral breast reconstructive surgery with TRAM flaps in 06/2005.  7.  Chronic pain   (patient is being followed by Dr. Vickey Huger, M.D.)  8.  Tobacco abuse (patient is not ready for smoking cessation in 03/2013)  9.  Osteopenia   PLAN: The patient had been receiving annual mammograms that Dr. Darnelle Catalan stated were not necessary due to bilateral TRAM flap reconstruction.  Mrs. Iacovelli is over 8 years from her definitive breast  surgery, but is not ready for a graduation from [facility] breast cancer program at this time.  We will be happy to continue to see her on annual basis.  The patient was encouraged to take Caltrate 600 mg by mouth daily and vitamin D3 1000 IUs by mouth daily in addition to walking 30 minutes daily (as tolerated) for osteopenia.  We plan to see Ms. Wellborn again in 04/2014 at which time we will check laboratories of CBC, CMP, LDH, and vitamin D level.  All questions were answered.  Mr. and Mrs. Gassner were encouraged to contact us in the interim with any problems, questions or concerns.   Larina Bras, NP-C 04/08/2013, 4:02 PM  ADDENDUM: This 57 year old [place], [place] woman establish herself in my practice today. We reviewed her diagnosis, treatment history and prognosis. In brief:  She underwent left lumpectomy and sentinel lymph node sampling February of 1999 for a pT2 pN0, stage IIA invasive breast cancer, estrogen receptor positive, which was treated with doxorubicin and cyclophosphamide x4under my former partner Lyndal Pulley. She received adjuvant radiation to the left breast and completed 5 years of antiestrogen therapy (she switched anti-estrogens many times but did continue to May of 2005).  She was then followed off treatment. In December of 2002 she also underwent total abdominal hysterectomy with bilateral salpingo-oophorectomy.  More recently, in March of 2006,  biopsy of a new mass in the same breast showed recurrence of her original tumor, and in May of 2006 the patient underwent bilateral mastectomies with left sided sentinel lymph node sampling. The right prophylactic mastectomy was benign. The left mastectomy showed a pT1c pN0, stage IA invasive ductal carcinoma with lobular features, grade 2, triple negative, with an MIB-1 of 6%.  Adjuvantly the patient receive docetaxel and cyclophosphamide x4 completed in August of 2006. She underwent bilateral TRAM reconstruction in October of 2006.  The patient understand physical exam is very sensitive for local recurrence in this setting. She does not need mammography or breast MRI for followup. She does need yearly physician breast exam. We are going to follow Joann for an additional 2 your before releasing her to her primary care physician. She knows to call for any problems that may develop before her next visit here.   I personally saw this patient and performed a substantive portion of this encounter with the listed APP documented above.   Lowella Dell, MD

## 2013-04-07 NOTE — Patient Instructions (Addendum)
Please contact us at [phone] if you have any questions or concerns.  Please continue to do well and enjoy life!!!  Get plenty of rest, drink plenty of water, exercise daily (walking), eat a balanced diet.  Take Caltrate 600 mg daily and Vitamin D3 1000 IUs daily.

## 2013-04-08 ENCOUNTER — Encounter: Payer: Self-pay | Admitting: Gastroenterology

## 2013-04-26 ENCOUNTER — Other Ambulatory Visit: Payer: Self-pay

## 2013-04-26 DIAGNOSIS — G620 Drug-induced polyneuropathy: Secondary | ICD-10-CM

## 2013-04-26 MED ORDER — OXYCODONE HCL 10 MG PO TABS: ORAL_TABLET | ORAL | Status: DC

## 2013-04-26 NOTE — Telephone Encounter (Signed)
Patient called requesting a refill on Oxycodone.  She would like the Rx mailed to her when it's ready.  Dr Dohmeier is out of the office, forwarding request to Dr Anne Hahn, [facility].

## 2013-04-26 NOTE — Telephone Encounter (Signed)
Rx signed and mailed.

## 2013-05-23 ENCOUNTER — Other Ambulatory Visit: Payer: Self-pay

## 2013-05-23 DIAGNOSIS — G620 Drug-induced polyneuropathy: Secondary | ICD-10-CM

## 2013-05-23 MED ORDER — OXYCODONE HCL 10 MG PO TABS: ORAL_TABLET | ORAL | Status: DC

## 2013-05-23 NOTE — Telephone Encounter (Signed)
Patient called requesting a refill on Oxycodone.  She would like the Rx mailed to her when it's ready.

## 2013-05-23 NOTE — Telephone Encounter (Signed)
Rx signed and mailed.

## 2013-06-02 ENCOUNTER — Telehealth: Payer: Self-pay | Admitting: Neurology

## 2013-06-02 DIAGNOSIS — G620 Drug-induced polyneuropathy: Secondary | ICD-10-CM

## 2013-06-03 NOTE — Telephone Encounter (Signed)
Patient says she would like to know her status as a patient. She would like to know will she be referred for pain management. If so, she would like to see Dr. Murray Hodgkins again.

## 2013-06-06 NOTE — Telephone Encounter (Signed)
I let patient know that referral has been made to Dr. Lennon Alstrom.

## 2013-06-06 NOTE — Telephone Encounter (Signed)
Refer to Dr Murray Hodgkins as requested, she has been advised to follow with a pain clinic in her last visit please call her and let her know! CD

## 2013-06-16 ENCOUNTER — Telehealth: Payer: Self-pay | Admitting: Neurology

## 2013-06-17 NOTE — Telephone Encounter (Signed)
Call pt about the referral for pain management, letting her know that it has already been sent to Dr. Lennon Alstrom.

## 2013-06-20 ENCOUNTER — Other Ambulatory Visit: Payer: Self-pay

## 2013-06-20 DIAGNOSIS — G620 Drug-induced polyneuropathy: Secondary | ICD-10-CM

## 2013-06-20 MED ORDER — OXYCODONE HCL 10 MG PO TABS: ORAL_TABLET | ORAL | Status: DC

## 2013-06-20 NOTE — Telephone Encounter (Signed)
Rx signed and mailed.

## 2013-06-20 NOTE — Telephone Encounter (Signed)
Patient called requesting a refill on Oxtcodone.  Says she has not heard from the pain clinic yet regarding appt.  She would like the Rx mailed to her when it's ready.

## 2013-06-22 ENCOUNTER — Telehealth: Payer: Self-pay

## 2013-06-22 NOTE — Telephone Encounter (Signed)
Patient called front desk inquiring about Oxycodone Rx.  They advised her it had been mailed Monday (10/13).  The patient said the mail did not run on Monday and she will take the last of her medication tonight.  They transferred her to me.  I repeated that the Rx had been mailed on Monday.  She said she would like another Rx written and wants to pick it up today.  I suggested she wait and see if the Rx comes in the mail today or possibly tomorrow.  Advised her because of the type of medication this is, we cannot rewrite it at this time, as it was just written on Monday.  (A Rx for #180 was written and mailed to her last month on the 15th.) She asked for my name and got upset saying we don't care if she runs out of meds or not because we sent her to a pain clinic and are just trying to get rid of her.  She then began arguing with someone named Erskine Squibb in the background who kept demanding to talk to me because she said the patient needs her meds.  Ms Martinovich kept telling Erskine Squibb she was not going to let her talk to me and it was not her business.  This went on for several minutes.  Finally she said she will "just suffer".  I asked her to call us back if she had not received the Rx by tomorrow.  She said "whatever" and her mail runs at 4:30 or after and it would be too late for her to call.

## 2013-07-21 ENCOUNTER — Other Ambulatory Visit: Payer: Self-pay | Admitting: Neurology

## 2013-07-21 ENCOUNTER — Telehealth: Payer: Self-pay | Admitting: Neurology

## 2013-07-21 DIAGNOSIS — M542 Cervicalgia: Secondary | ICD-10-CM

## 2013-07-21 DIAGNOSIS — G620 Drug-induced polyneuropathy: Secondary | ICD-10-CM

## 2013-07-21 DIAGNOSIS — C50919 Malignant neoplasm of unspecified site of unspecified female breast: Secondary | ICD-10-CM

## 2013-07-21 MED ORDER — OXYCODONE HCL 10 MG PO TABS: ORAL_TABLET | ORAL | Status: DC

## 2013-07-21 MED ORDER — CYCLOBENZAPRINE HCL 10 MG PO TABS: 10.0000 mg | ORAL_TABLET | Freq: Every day | ORAL | Status: DC | PRN

## 2013-07-21 NOTE — Telephone Encounter (Signed)
Running out of pain meds

## 2013-07-28 ENCOUNTER — Telehealth: Payer: Self-pay | Admitting: *Deleted

## 2013-07-28 NOTE — Telephone Encounter (Signed)
Called patient to cancel appt on 08-23-13. Cm has a meeting that day. Patient did not answer told her to call the office back to reschedule.

## 2013-08-08 ENCOUNTER — Telehealth: Payer: Self-pay | Admitting: Neurology

## 2013-08-08 DIAGNOSIS — G620 Drug-induced polyneuropathy: Secondary | ICD-10-CM

## 2013-08-09 NOTE — Telephone Encounter (Signed)
Please advise 

## 2013-08-10 MED ORDER — OXYCODONE HCL 10 MG PO TABS: ORAL_TABLET | ORAL | Status: DC

## 2013-08-10 NOTE — Telephone Encounter (Signed)
Rx mailed.

## 2013-08-10 NOTE — Telephone Encounter (Signed)
We referred her for pain management and she had been rescheduled once by the pain clinic, now She reports ,that she was still  not seen ?  Please inquire further with Dr Chari Manning office  - does he feel this is not an appropriate referral for his clinic?

## 2013-08-17 ENCOUNTER — Encounter (INDEPENDENT_AMBULATORY_CARE_PROVIDER_SITE_OTHER): Payer: Self-pay

## 2013-08-17 ENCOUNTER — Other Ambulatory Visit: Payer: Self-pay

## 2013-08-17 ENCOUNTER — Encounter: Payer: Self-pay | Admitting: Nurse Practitioner

## 2013-08-17 ENCOUNTER — Ambulatory Visit (INDEPENDENT_AMBULATORY_CARE_PROVIDER_SITE_OTHER): Payer: Medicare Other | Admitting: Nurse Practitioner

## 2013-08-17 ENCOUNTER — Other Ambulatory Visit: Payer: Self-pay | Admitting: Neurology

## 2013-08-17 VITALS — BP 135/80 | HR 106 | Ht 63.5 in | Wt 134.0 lb

## 2013-08-17 DIAGNOSIS — G620 Drug-induced polyneuropathy: Secondary | ICD-10-CM

## 2013-08-17 DIAGNOSIS — C50919 Malignant neoplasm of unspecified site of unspecified female breast: Secondary | ICD-10-CM

## 2013-08-17 MED ORDER — OXYCODONE HCL 10 MG PO TABS: ORAL_TABLET | ORAL | Status: DC

## 2013-08-17 NOTE — Addendum Note (Signed)
Addended by: Melvyn Novas on: 08/17/2013 02:56 PM   Modules accepted: Orders

## 2013-08-17 NOTE — Telephone Encounter (Signed)
Rx for Oxycodone mailed

## 2013-08-17 NOTE — Telephone Encounter (Signed)
Spoke to Dr. Chari Manning office and they were familiar with the patient.  He does not handle pain management of her type and didn't have anything to offer her.  The patient had an office visit with Eber Jones today, and she recommended a referral to Dr. Thyra Breed with [facility].   I spoke to the patient and relayed the information, she will continue to need pain medicine refilled until the new appointment with pain management.  She told me she had 14 Oxycodone left and normally is prescribed 180 count, she is taking 6 pills a day.  I will put in new referral to day. Please advise.

## 2013-08-17 NOTE — Patient Instructions (Signed)
Will refer to pain clinic Need to check with Dr. Lajoyce Corners office to find out why the referral was not accepted

## 2013-08-17 NOTE — Progress Notes (Addendum)
[facility]  PATIENT: Tamara Webb DOB: [birth year]   REASON FOR VISIT: Follow up for chronic neuropathy pain   HISTORY OF PRESENT ILLNESS: Tamara Webb, 57 year old female returns for followup. She was last in the office the Dr. Vickey Huger 02/08/2013. She has a history of left and right breast cancer with recurrence. She is currently on narcotics and was asked to be seen by pain management. She has a painful neuropathy status post chemotherapy. A referral has been sent to [facility] but the patient was called and told" he could not do anything for her.". She is wanting to be referred to another pain center here in town. She just received her medication Oxycodone this past Friday the fifth. She was without drug for a week  REVIEW OF SYSTEMS: Full 14 system review of systems performed and notable only for those listed, all others are neg:  Constitutional: N/A  Cardiovascular: N/A  Ear/Nose/Throat: N/A  Skin: N/A  Eyes: N/A  Respiratory: N/A  Gastroitestinal: N/A  Hematology/Lymphatic: N/A  Endocrine: Cold intolerance  Musculoskeletal: Joint pain, back pain, aching muscles Allergy/Immunology: N/A  Neurological: Memory loss, dizziness, numbness, weakness Psychiatric: N/A   ALLERGIES: Allergies  Allergen Reactions  . Gadolinium Derivatives Shortness Of Breath and Swelling    Given for xray  . Penicillins Shortness Of Breath  . Ivp Dye [Iodinated Diagnostic Agents]   . Pepto-Bismol [Bismuth Subsalicylate] Swelling    HOME MEDICATIONS: Outpatient Prescriptions Prior to Visit  Medication Sig Dispense Refill  . Cholecalciferol (VITAMIN D PO) Take 5,000 Units by mouth daily.      Marland Kitchen gabapentin (NEURONTIN) 600 MG tablet Take 1 tablet (600 mg total) by mouth 3 (three) times daily.  270 tablet  3  . Multiple Vitamin (MULTIVITAMIN) tablet Take 1 tablet by mouth daily.      . Oxycodone HCl 10 MG TABS One po q 4 to 6 hours po .  No more prescriptions from [facility].  120  tablet  0  . cyclobenzaprine (FLEXERIL) 10 MG tablet Take 1 tablet (10 mg total) by mouth daily as needed.  30 tablet  5   No facility-administered medications prior to visit.    PAST MEDICAL HISTORY: Past Medical History  Diagnosis Date  . Cancer 1999&2006    breast cancer x2   . Allergy   . Heart murmur 1999  . COPD (chronic obstructive pulmonary disease)   . Neuropathy   . Breast cancer     bilateral   . Neuropathy due to chemotherapeutic drug     breast cancer , Dr Donnie Coffin.     PAST SURGICAL HISTORY: Past Surgical History  Procedure Laterality Date  . Total abdominal hysterectomy  2001  . Mastectomy  2006    bil.mastectomy with abdominal trans flap.  . Breast reconstruction    . Lymph gland excision      left arm  . Ectopic pregnancy surgery    . Appendectomy    . Tonsillectomy  as child  . Ovary surgery      after ectopic sx  . Carpal tunnel release      right    FAMILY HISTORY: Family History  Problem Relation Age of Onset  . Colon polyps Sister 69  . Diabetes Sister   . Colon cancer Neg Hx   . Diabetes Mother   . Multiple sclerosis Mother   . Heart attack Mother   . Stroke Mother   . Diabetes Father   . Heart attack Father   .  Cancer Father     Kidney cancer    SOCIAL HISTORY: History   Social History  . Marital Status: Married    Spouse Name: Loraine Leriche     Number of Children: 0  . Years of Education: 12+   Occupational History  .     Social History Main Topics  . Smoking status: Current Every Day Smoker -- 1.00 packs/day    Types: Cigarettes  . Smokeless tobacco: Never Used     Comment: quit for 20 years,just started back in 2000  . Alcohol Use: No  . Drug Use: No  . Sexual Activity: Yes    Birth Control/ Protection: Surgical   Other Topics Concern  . Not on file   Social History Narrative   Patient lives with Loraine Leriche her husband.    Patient has no children.    Patient is currently not working.    Patient has some college.       PHYSICAL EXAM  Filed Vitals:   08/17/13 1104  BP: 135/80  Pulse: 106  Height: 5' 3.5" (1.613 m)  Weight: 134 lb (60.782 kg)   Body mass index is 23.36 kg/(m^2).  Generalized: Well developed, in no acute distress  Neurological examination   Mentation: Alert oriented to time, place, history taking. Follows all commands speech and language fluent  Cranial nerve II-XII: Pupils were equal round reactive to light extraocular movements were full, visual field were full on confrontational test. Facial sensation and strength were normal. hearing was intact to finger rubbing bilaterally. Uvula tongue midline. head turning and shoulder shrug were normal and symmetric.Tongue protrusion into cheek strength was normal. Motor: normal bulk and tone, full strength in the BUE, BLE, fine finger movements normal, no pronator drift. No focal weakness Sensory: normal and symmetric to light touch, pinprick, and  decreased vibration to toes Coordination: finger-nose-finger, heel-to-shin bilaterally, no dysmetria Reflexes: Brachioradialis 2/2, biceps 2/2, triceps 2/2, patellar 2/2, Achilles 2/2, plantar responses were flexor bilaterally. Gait and Station: Rising up from seated position without assistance, normal stance,  moderate stride, good arm swing, smooth turning, able to perform tiptoe, and heel walking without difficulty. Tandem gait is steady  DIAGNOSTIC DATA (LABS, IMAGING, TESTING) - I reviewed patient records, labs, notes, testing and imaging myself where available.  Lab Results  Component Value Date   WBC 11.0* 02/08/2013   HGB 15.2 02/08/2013   HCT 44.4 02/08/2013   MCV 87 02/08/2013   PLT 272 02/08/2013      Component Value Date/Time   NA 135 02/08/2013 1348   NA 138 03/23/2012 1410   K 4.3 02/08/2013 1348   CL 97 02/08/2013 1348   CO2 28 02/08/2013 1348   GLUCOSE 101* 02/08/2013 1348   GLUCOSE 119* 03/23/2012 1410   BUN 6 02/08/2013 1348   BUN 7 03/23/2012 1410   CREATININE 0.58 02/08/2013 1348    CALCIUM 10.1 02/08/2013 1348   PROT 7.0 02/08/2013 1348   PROT 6.9 03/23/2012 1410   ALBUMIN 4.3 03/23/2012 1410   AST 21 02/08/2013 1348   ALT 18 02/08/2013 1348   ALKPHOS 108 02/08/2013 1348   BILITOT 0.3 02/08/2013 1348   GFRNONAA 104 02/08/2013 1348   GFRAA 120 02/08/2013 1348      ASSESSMENT AND PLAN  57 y.o. year old female  has a past medical history of Cancer (1999&2006); Allergy; Heart murmur (1999); COPD (chronic obstructive pulmonary disease); Neuropathy; Breast cancer; and Neuropathy due to chemotherapeutic drug. here to followup. She is on narcotics for  pain management and has been referred to pain management Center however Dr. Murray Hodgkins told her there was nothing he could do. Made aware Dr. Vickey Huger does not follow  chronic pain and neither do I Discussed with Dr. Vickey Huger Will refer to another pain clinic Need to check with Dr. Lajoyce Corners office to find out why the referral was not accepted, will get the nurses to check on this Patient made aware we will not let her RX lapse, she does need to pick up the RX  Nilda Riggs, [facility], [facility], APRN  [facility] [address] [place], [place] [address] [phone]   I agree with A. and Plan.  Melvyn Novas, MD

## 2013-08-23 ENCOUNTER — Ambulatory Visit: Payer: Medicare Other | Admitting: Nurse Practitioner

## 2013-09-15 ENCOUNTER — Other Ambulatory Visit: Payer: Self-pay

## 2013-09-15 DIAGNOSIS — T451X5A Adverse effect of antineoplastic and immunosuppressive drugs, initial encounter: Principal | ICD-10-CM

## 2013-09-15 DIAGNOSIS — G620 Drug-induced polyneuropathy: Secondary | ICD-10-CM

## 2013-09-15 NOTE — Telephone Encounter (Signed)
Patient called, left message saying she wants to pick up another Rx for Oxycodone before Monday.  Also says #120 just doesn't last 30 days. Per OV in Dec, and note on last Rx, we will no longer fill this medication, and patient has been referred to a pain clinic.  I called patient back, got no answer.  Left message suggesting she contact the pain clinic at [phone].

## 2013-09-15 NOTE — Telephone Encounter (Signed)
I spoke with the patient.  She said she has not heard from the pain clinic.  She will call them to follow up.  Says she needs a refill on Oxycodone and cannot go without the meds.  She does not want the Rx mailed to her, she would like to pick it up tomorrow.  Says she was told we would not let her run out of meds.  She will call back and let us know what the pain clinic says.  Dr Brett Fairy is out of the office.  Forwarding request to [facility]

## 2013-09-16 ENCOUNTER — Other Ambulatory Visit: Payer: Self-pay

## 2013-09-16 DIAGNOSIS — G620 Drug-induced polyneuropathy: Secondary | ICD-10-CM

## 2013-09-16 DIAGNOSIS — T451X5A Adverse effect of antineoplastic and immunosuppressive drugs, initial encounter: Principal | ICD-10-CM

## 2013-09-16 MED ORDER — OXYCODONE HCL 10 MG PO TABS: ORAL_TABLET | ORAL | Status: DC

## 2013-09-16 NOTE — Telephone Encounter (Signed)
Dr Dohmeier is out of the office, forwarding to [facility]. Patient has not heard from pain clinic yet and has been advised to follow up with them.  She is requesting a refill until she can be seen by them.

## 2013-09-16 NOTE — Telephone Encounter (Signed)
[facility] has taken care of this.  Thank you.

## 2013-09-19 ENCOUNTER — Telehealth: Payer: Self-pay

## 2013-09-19 NOTE — Telephone Encounter (Signed)
Patient called to say she spoke with Claiborne Billings at the pain clinic and they verified they did get her referral on 12/22.  She says they told her they haven't had a chance to view her file yet, and currently have nothing open appt wise until March.  They will call her back to schedule appt once files have been reviewed.  She just wanted to let us know.

## 2013-09-29 DIAGNOSIS — Z0289 Encounter for other administrative examinations: Secondary | ICD-10-CM

## 2013-10-03 ENCOUNTER — Telehealth: Payer: Self-pay

## 2013-10-03 ENCOUNTER — Other Ambulatory Visit: Payer: Self-pay | Admitting: Neurology

## 2013-10-03 DIAGNOSIS — G620 Drug-induced polyneuropathy: Secondary | ICD-10-CM

## 2013-10-03 DIAGNOSIS — T451X5A Adverse effect of antineoplastic and immunosuppressive drugs, initial encounter: Principal | ICD-10-CM

## 2013-10-03 MED ORDER — OXYCODONE HCL 10 MG PO TABS: ORAL_TABLET | ORAL | Status: DC

## 2013-10-03 NOTE — Telephone Encounter (Signed)
Called patient to inform her that her Rx was ready to be picked up at the front desk and if she has any other problems, questions or concerns to call the office. Patient verbalized understanding. °

## 2013-10-03 NOTE — Telephone Encounter (Signed)
Patient called indicating she needs a refill on Oxycodone.  She has contacted the pain clinic, but they have not been able to schedule an appt for her yet.  She says they told her it will be March or later before they will be able to get her in.  A Rx was written on 01/05 for #120.  She says that only lasts for 20 days and she either needs a refill of #120 or of #180 (which will last 30 days per patient).  Would you like to refill, if so what qnty?  Please advise.  Thank you.

## 2013-10-13 ENCOUNTER — Telehealth: Payer: Self-pay | Admitting: Neurology

## 2013-10-13 NOTE — Telephone Encounter (Signed)
Pt called in to check the status of the long term disability paperwork that she submitted on 09/28/2013.  Please call either the cell [phone] or home [phone].  Thank you.

## 2013-10-31 ENCOUNTER — Other Ambulatory Visit: Payer: Self-pay

## 2013-10-31 DIAGNOSIS — T451X5A Adverse effect of antineoplastic and immunosuppressive drugs, initial encounter: Principal | ICD-10-CM

## 2013-10-31 DIAGNOSIS — G620 Drug-induced polyneuropathy: Secondary | ICD-10-CM

## 2013-10-31 MED ORDER — OXYCODONE HCL 10 MG PO TABS: ORAL_TABLET | ORAL | Status: DC

## 2013-11-02 ENCOUNTER — Telehealth: Payer: Self-pay | Admitting: Neurology

## 2013-11-02 NOTE — Telephone Encounter (Signed)
Patient called to state that she is waiting on getting her Oxycodone refill, checked up front and it wasn't there, patient is trying to get it before the bad weather comes.

## 2013-11-02 NOTE — Telephone Encounter (Signed)
It looks like Dr Brett Fairy okayed this Rx on 02/23.  Will send message to [facility] asking her to check on Rx.

## 2013-11-02 NOTE — Telephone Encounter (Signed)
Called patient to inform her that her Rx was ready to be picked up at the front desk and if she has any other problems, questions or concerns to call the office. Patient verbalized understanding. °

## 2013-11-24 NOTE — Telephone Encounter (Signed)
Patient calling to request Oxycodone refill. Please call patient and advise.

## 2013-11-25 ENCOUNTER — Other Ambulatory Visit: Payer: Self-pay

## 2013-11-25 DIAGNOSIS — G620 Drug-induced polyneuropathy: Secondary | ICD-10-CM

## 2013-11-25 DIAGNOSIS — T451X5A Adverse effect of antineoplastic and immunosuppressive drugs, initial encounter: Principal | ICD-10-CM

## 2013-11-25 MED ORDER — OXYCODONE HCL 10 MG PO TABS: ORAL_TABLET | ORAL | Status: DC

## 2013-11-25 NOTE — Telephone Encounter (Signed)
Called pt inform her that her Rx was ready to be picked up at the front desk and if she has any other problems, questions or concerns to call the office. Pt verbalized understanding.

## 2013-11-25 NOTE — Telephone Encounter (Signed)
Dr Dohmeier is out of the office.  Forwarding request to [facility].   

## 2013-12-02 ENCOUNTER — Encounter: Payer: Self-pay | Admitting: Gastroenterology

## 2013-12-21 ENCOUNTER — Telehealth: Payer: Self-pay | Admitting: *Deleted

## 2013-12-21 NOTE — Telephone Encounter (Signed)
Pt called requesting rx forOxycodone HCl 10 MG TABS.  Please call pt when ready for pick up.  Thanks

## 2013-12-21 NOTE — Telephone Encounter (Signed)
I spoke with the patient.  She would ike to get the Rx Friday.  She still has not heard from the pain clinic for an appt.

## 2013-12-23 ENCOUNTER — Telehealth: Payer: Self-pay | Admitting: Neurology

## 2013-12-23 ENCOUNTER — Other Ambulatory Visit: Payer: Self-pay

## 2013-12-23 DIAGNOSIS — G620 Drug-induced polyneuropathy: Secondary | ICD-10-CM

## 2013-12-23 DIAGNOSIS — T451X5A Adverse effect of antineoplastic and immunosuppressive drugs, initial encounter: Principal | ICD-10-CM

## 2013-12-23 MED ORDER — OXYCODONE HCL 10 MG PO TABS: ORAL_TABLET | ORAL | Status: DC

## 2013-12-23 NOTE — Telephone Encounter (Signed)
Pt calling because she hasn't been called about picking up her Oxycodone HCl 10 MG TABS states she was told it would be ready today and she has like a 86min drive and needs to know soon

## 2013-12-23 NOTE — Telephone Encounter (Signed)
Patient has still not been able to get an appt at the pain clinic.  She would ike to know what she should do.  Please advise.  Thank you.

## 2013-12-23 NOTE — Telephone Encounter (Signed)
Called pt to inform her that her Rx was ready to be picked up at the front desk and if she has any other problems, questions or concerns to call the office. Pt verbalized understanding. °

## 2013-12-23 NOTE — Telephone Encounter (Signed)
Rx is pending provider approval.  As soon as it has been approved, we will call the patient.

## 2014-01-17 ENCOUNTER — Telehealth: Payer: Self-pay | Admitting: Neurology

## 2014-01-17 NOTE — Telephone Encounter (Signed)
Pt called to request a written refill Oxycodone HCl 10 MG TABS, please call pt when ready for pick up. Thanks

## 2014-01-18 ENCOUNTER — Other Ambulatory Visit: Payer: Self-pay

## 2014-01-18 DIAGNOSIS — T451X5A Adverse effect of antineoplastic and immunosuppressive drugs, initial encounter: Principal | ICD-10-CM

## 2014-01-18 DIAGNOSIS — G620 Drug-induced polyneuropathy: Secondary | ICD-10-CM

## 2014-01-18 MED ORDER — OXYCODONE HCL 10 MG PO TABS: ORAL_TABLET | ORAL | Status: DC

## 2014-01-18 NOTE — Telephone Encounter (Signed)
Called pt to inform her that her Rx was ready to be picked up at the front desk and if she has any other problems, questions or concerns to call the office. Pt verbalized understanding. °

## 2014-01-20 ENCOUNTER — Telehealth: Payer: Self-pay | Admitting: *Deleted

## 2014-01-20 NOTE — Telephone Encounter (Signed)
Called pt to inform her that the pain management referral had already been put in with [facility] and that she need to call them at [phone]. I advised the pt that if she has any other problems, questions or concerns to call the office. Pt verbalized understanding.

## 2014-01-23 ENCOUNTER — Telehealth: Payer: Self-pay | Admitting: *Deleted

## 2014-01-23 NOTE — Telephone Encounter (Signed)
Who would give a patient an unsigned prescription- with a note on it- "please check for last refill date " and  placed on the pharm tech desk???? [employer].

## 2014-01-23 NOTE — Telephone Encounter (Signed)
Pt brought prescription by and Dr. Brett Fairy signed this afternoon 1525.

## 2014-02-13 ENCOUNTER — Telehealth: Payer: Self-pay | Admitting: Neurology

## 2014-02-13 DIAGNOSIS — G620 Drug-induced polyneuropathy: Secondary | ICD-10-CM

## 2014-02-13 DIAGNOSIS — T451X5A Adverse effect of antineoplastic and immunosuppressive drugs, initial encounter: Principal | ICD-10-CM

## 2014-02-13 MED ORDER — OXYCODONE HCL 10 MG PO TABS: ORAL_TABLET | ORAL | Status: DC

## 2014-02-13 NOTE — Telephone Encounter (Signed)
Pt calling requesting refill on oxycodone. Please advise

## 2014-02-13 NOTE — Telephone Encounter (Signed)
Patient calling to get refill for Oxycodone HCl 10 MG TABS.

## 2014-02-14 NOTE — Telephone Encounter (Signed)
Called pt to inform her that her Rx was ready to be picked up at the front desk and if she has any other problems, questions or concerns to call the office. Pt verbalized understanding. °

## 2014-02-16 ENCOUNTER — Telehealth: Payer: Self-pay | Admitting: Neurology

## 2014-02-16 NOTE — Telephone Encounter (Signed)
Patient came to pick up her pain medicine and said it was for only 90 tabs instead of 180 tabs.  I explained that she is suppose to be seeing the pain clinic, she relayed that she is still waiting for an appointment with Dr. Nicholaus Bloom.  This referral has been in since January, so it has been frustrating.  I explained I would relay the information to the doctor.

## 2014-02-17 NOTE — Telephone Encounter (Signed)
Can we call dr Hardin Negus and find out what is going on???

## 2014-02-20 NOTE — Telephone Encounter (Signed)
Spoke with Dr. Hardin Negus office and it can take several months for them to schedule an appointment.  She relayed that the referral is with the doctor, for him to review, but she could not give me an exact time when the patient can be seen.

## 2014-02-20 NOTE — Telephone Encounter (Signed)
A referral has been since this January with the pain management office and is still not reviewed? That's very atypical .   I will ask Dr . Hardin Negus directly per [facility] if possible , Thank you for the update. CD

## 2014-03-02 ENCOUNTER — Ambulatory Visit (INDEPENDENT_AMBULATORY_CARE_PROVIDER_SITE_OTHER): Payer: Medicare Other | Admitting: Nurse Practitioner

## 2014-03-02 ENCOUNTER — Encounter: Payer: Self-pay | Admitting: Nurse Practitioner

## 2014-03-02 ENCOUNTER — Encounter (INDEPENDENT_AMBULATORY_CARE_PROVIDER_SITE_OTHER): Payer: Self-pay

## 2014-03-02 VITALS — BP 134/80 | HR 93 | Ht 65.0 in | Wt 130.0 lb

## 2014-03-02 DIAGNOSIS — G620 Drug-induced polyneuropathy: Secondary | ICD-10-CM | POA: Diagnosis not present

## 2014-03-02 DIAGNOSIS — C50919 Malignant neoplasm of unspecified site of unspecified female breast: Secondary | ICD-10-CM

## 2014-03-02 DIAGNOSIS — T451X5A Adverse effect of antineoplastic and immunosuppressive drugs, initial encounter: Principal | ICD-10-CM

## 2014-03-02 MED ORDER — CYCLOBENZAPRINE HCL 10 MG PO TABS: 10.0000 mg | ORAL_TABLET | Freq: Every day | ORAL | Status: DC | PRN

## 2014-03-02 MED ORDER — GABAPENTIN 600 MG PO TABS: 600.0000 mg | ORAL_TABLET | Freq: Three times a day (TID) | ORAL | Status: AC

## 2014-03-02 NOTE — Patient Instructions (Signed)
Dr Hardin Negus office was contacted about pain management, patient should hear back today Continue Neurontin and Flexeril F/U in 6 months next visit with Dr. Brett Fairy

## 2014-03-02 NOTE — Progress Notes (Signed)
[facility]  PATIENT: Tamara Webb DOB: March 19, 1956   REASON FOR VISIT: for neuropathy due to chemotherapy  HISTORY OF PRESENT ILLNESS:Tamara Webb, 58 year old returns for followup.She has a history of left and right breast cancer with recurrence. She is currently on narcotics and was asked to be seen by pain management. She has a painful neuropathy status post chemotherapy. She is currently on Neurontin 600 mg 3 times daily, but higher doses caused swelling of the lower extremities. She also takes Flexeril. She has been prescribed oxycodone until she is seen by pain management. She returns for reevaluation. She claims her neuropathy has worsened.  REVIEW OF SYSTEMS: Full 14 system review of systems performed and notable only for those listed, all others are neg:  Constitutional: Fatigue Cardiovascular: N/A  Ear/Nose/Throat: N/A  Skin: N/A  Eyes: N/A  Respiratory: N/A  Gastroitestinal: N/A  Hematology/Lymphatic: N/A  Endocrine: Intolerance to heat and cold  Musculoskeletal: Joint pain, back pain, neck stiffness, aching muscles  Allergy/Immunology: Environmental allergies  Neurological: Numbness, weakness  Psychiatric: Depression anxiety  Sleep : Frequent awakening   ALLERGIES: Allergies  Allergen Reactions  . Gadolinium Derivatives Shortness Of Breath and Swelling    Given for xray  . Penicillins Shortness Of Breath  . Ivp Dye [Iodinated Diagnostic Agents]   . Pepto-Bismol [Bismuth Subsalicylate] Swelling    HOME MEDICATIONS: Outpatient Prescriptions Prior to Visit  Medication Sig Dispense Refill  . Cholecalciferol (VITAMIN D PO) Take 5,000 Units by mouth daily.      . cyclobenzaprine (FLEXERIL) 10 MG tablet Take 10 mg by mouth daily as needed (1-2 tabs as needed).      . gabapentin (NEURONTIN) 600 MG tablet Take 1 tablet (600 mg total) by mouth 3 (three) times daily.  270 tablet  3  . Multiple Vitamin (MULTIVITAMIN) tablet Take 1 tablet by mouth  daily.      . Oxycodone HCl 10 MG TABS One po q 4 to 6 hours po .  Patient will be followed by pain clinic  90 tablet  0   No facility-administered medications prior to visit.    PAST MEDICAL HISTORY: Past Medical History  Diagnosis Date  . Cancer 1999&2006    breast cancer x2   . Allergy   . Heart murmur 1999  . COPD (chronic obstructive pulmonary disease)   . Neuropathy   . Breast cancer     bilateral   . Neuropathy due to chemotherapeutic drug     breast cancer , Dr Truddie Coco.     PAST SURGICAL HISTORY: Past Surgical History  Procedure Laterality Date  . Total abdominal hysterectomy  2001  . Mastectomy  2006    bil.mastectomy with abdominal trans flap.  . Breast reconstruction    . Lymph gland excision      left arm  . Ectopic pregnancy surgery    . Appendectomy    . Tonsillectomy  as child  . Ovary surgery      after ectopic sx  . Carpal tunnel release      right    FAMILY HISTORY: Family History  Problem Relation Age of Onset  . Colon polyps Sister 1  . Diabetes Sister   . Colon cancer Neg Hx   . Diabetes Mother   . Multiple sclerosis Mother   . Heart attack Mother   . Stroke Mother   . Diabetes Father   . Heart attack Father   . Cancer Father     Kidney cancer  SOCIAL HISTORY: History   Social History  . Marital Status: Married    Spouse Name: [name]     Number of Children: 0  . Years of Education: 12+   Occupational History  .     Social History Main Topics  . Smoking status: Current Every Day Smoker -- 1.00 packs/day    Types: Cigarettes  . Smokeless tobacco: Never Used     Comment: quit for 20 years,just started back in 2000  . Alcohol Use: No  . Drug Use: No  . Sexual Activity: Yes    Birth Control/ Protection: Surgical   Other Topics Concern  . Not on file   Social History Narrative   Patient lives with [name] her husband.    Patient has no children.    Patient is currently not working.    Patient has some college.       PHYSICAL EXAM  Filed Vitals:   03/02/14 1102  BP: 134/80  Pulse: 93  Height: 5\' 5"  (1.651 m)  Weight: 130 lb (58.968 kg)   Body mass index is 21.63 kg/(m^2). Generalized: Well developed, in no acute distress  Neurological examination  Mentation: Alert oriented to time, place, history taking. Follows all commands speech and language fluent  Cranial nerve II-XII: Pupils were equal round reactive to light extraocular movements were full, visual field were full on confrontational test. Facial sensation and strength were normal. hearing was intact to finger rubbing bilaterally. Uvula tongue midline. head turning and shoulder shrug were normal and symmetric.Tongue protrusion into cheek strength was normal.  Motor: normal bulk and tone, full strength in the BUE, BLE, fine finger movements normal, no pronator drift. No focal weakness  Sensory: normal and symmetric to light touch, pinprick, and decreased vibration to toes and ankles. Coordination: finger-nose-finger, heel-to-shin bilaterally, no dysmetria  Reflexes: Brachioradialis 2/2, biceps 2/2, triceps 2/2, patellar 2/2, Achilles 1/1, plantar responses were flexor bilaterally.  Gait and Station: Rising up from seated position without assistance, limping gait,  unable to perform tiptoe, and heel walking without difficulty. Tandem gait is mildly unsteady  DIAGNOSTIC DATA (LABS, IMAGING, TESTING) - I reviewed patient records, labs, notes, testing and imaging myself where available.  Lab Results  Component Value Date   WBC 11.0* 02/08/2013   HGB 15.2 02/08/2013   HCT 44.4 02/08/2013   MCV 87 02/08/2013   PLT 272 02/08/2013      Component Value Date/Time   NA 135 02/08/2013 1348   NA 138 03/23/2012 1410   K 4.3 02/08/2013 1348   CL 97 02/08/2013 1348   CO2 28 02/08/2013 1348   GLUCOSE 101* 02/08/2013 1348   GLUCOSE 119* 03/23/2012 1410   BUN 6 02/08/2013 1348   BUN 7 03/23/2012 1410   CREATININE 0.58 02/08/2013 1348   CALCIUM 10.1 02/08/2013 1348   PROT  7.0 02/08/2013 1348   PROT 6.9 03/23/2012 1410   ALBUMIN 4.3 03/23/2012 1410   AST 21 02/08/2013 1348   ALT 18 02/08/2013 1348   ALKPHOS 108 02/08/2013 1348   BILITOT 0.3 02/08/2013 1348   GFRNONAA 104 02/08/2013 1348   GFRAA 120 02/08/2013 1348    ASSESSMENT AND PLAN  58 y.o. year old female  has a past medical history of Cancer (1999&2006); Allergy; Heart murmur (1999); COPD (chronic obstructive pulmonary disease);  Breast cancer; and Neuropathy due to chemotherapeutic drug. here to followup. She is currently on Neurontin and Flexeril and oxycodone. She has had pain management referral but has not heard from Dr.  [facility]  office  Dr Hardin Negus office was contacted about pain management, patient should hear back today Continue Neurontin and Flexeril F/U in 6 months next visit with Dr. Brett Fairy Dennie Bible, [facility], [facility], [facility] [address], [place], [place] [address] [phone]

## 2014-03-02 NOTE — Progress Notes (Signed)
I agree with the assessment and plan as directed by NP .The patient is known to me .   DOHMEIER,CARMEN, MD  

## 2014-03-03 ENCOUNTER — Telehealth: Payer: Self-pay | Admitting: Nurse Practitioner

## 2014-03-03 DIAGNOSIS — G620 Drug-induced polyneuropathy: Secondary | ICD-10-CM

## 2014-03-03 DIAGNOSIS — T451X5A Adverse effect of antineoplastic and immunosuppressive drugs, initial encounter: Principal | ICD-10-CM

## 2014-03-03 NOTE — Telephone Encounter (Signed)
Patient does not have an appointment yet with the pain clinic--they told her they would call next week to schedule an appointment and the appointment will not be scheduled until one or one and a half months-patient needs a written Rx for Oxycodone until appointment at pain clinic--please call patient when ready for pickup.

## 2014-03-03 NOTE — Telephone Encounter (Signed)
Patient states she is still not able to get an appt at the pain clinic for another 4-6 weeks and needs another Rx for Oxycodone.  Please advise.  Thank you.

## 2014-03-06 MED ORDER — OXYCODONE HCL 10 MG PO TABS: ORAL_TABLET | ORAL | Status: DC

## 2014-03-06 NOTE — Telephone Encounter (Signed)
Called pt to inform her that her Rx was ready to be picked up at the front desk and if she has any other problems, questions or concerns to call the office. Pt verbalized understanding. °

## 2014-03-09 ENCOUNTER — Telehealth: Payer: Self-pay | Admitting: Nurse Practitioner

## 2014-03-09 MED ORDER — CYCLOBENZAPRINE HCL 10 MG PO TABS: 10.0000 mg | ORAL_TABLET | Freq: Every day | ORAL | Status: DC | PRN

## 2014-03-09 NOTE — Telephone Encounter (Signed)
I called back and was transferred to their voicemail.  Rx has been resent.

## 2014-03-09 NOTE — Telephone Encounter (Signed)
Tamara Webb Salon Pharmacist at [facility] [phone], questioning dosage directions received for cyclobenzaprine (FLEXERIL) 10 MG tablet.  Please call and advise

## 2014-03-15 DIAGNOSIS — G620 Drug-induced polyneuropathy: Secondary | ICD-10-CM | POA: Diagnosis not present

## 2014-03-15 DIAGNOSIS — M542 Cervicalgia: Secondary | ICD-10-CM | POA: Diagnosis not present

## 2014-03-15 DIAGNOSIS — G894 Chronic pain syndrome: Secondary | ICD-10-CM | POA: Diagnosis not present

## 2014-03-15 DIAGNOSIS — M545 Low back pain, unspecified: Secondary | ICD-10-CM | POA: Diagnosis not present

## 2014-03-15 DIAGNOSIS — Z79899 Other long term (current) drug therapy: Secondary | ICD-10-CM | POA: Diagnosis not present

## 2014-04-10 ENCOUNTER — Other Ambulatory Visit: Payer: Self-pay | Admitting: *Deleted

## 2014-04-10 DIAGNOSIS — C50919 Malignant neoplasm of unspecified site of unspecified female breast: Secondary | ICD-10-CM

## 2014-04-10 DIAGNOSIS — G620 Drug-induced polyneuropathy: Secondary | ICD-10-CM

## 2014-04-10 DIAGNOSIS — T451X5A Adverse effect of antineoplastic and immunosuppressive drugs, initial encounter: Secondary | ICD-10-CM

## 2014-04-11 ENCOUNTER — Ambulatory Visit (HOSPITAL_BASED_OUTPATIENT_CLINIC_OR_DEPARTMENT_OTHER): Payer: Medicare Other | Admitting: Oncology

## 2014-04-11 ENCOUNTER — Other Ambulatory Visit (HOSPITAL_BASED_OUTPATIENT_CLINIC_OR_DEPARTMENT_OTHER): Payer: Medicare Other

## 2014-04-11 VITALS — BP 121/76 | HR 90 | Temp 98.7°F | Resp 18 | Ht 65.0 in | Wt 128.0 lb

## 2014-04-11 DIAGNOSIS — G620 Drug-induced polyneuropathy: Secondary | ICD-10-CM

## 2014-04-11 DIAGNOSIS — G8929 Other chronic pain: Secondary | ICD-10-CM | POA: Diagnosis not present

## 2014-04-11 DIAGNOSIS — Z853 Personal history of malignant neoplasm of breast: Secondary | ICD-10-CM

## 2014-04-11 DIAGNOSIS — C50912 Malignant neoplasm of unspecified site of left female breast: Secondary | ICD-10-CM | POA: Insufficient documentation

## 2014-04-11 DIAGNOSIS — M949 Disorder of cartilage, unspecified: Secondary | ICD-10-CM

## 2014-04-11 DIAGNOSIS — M899 Disorder of bone, unspecified: Secondary | ICD-10-CM | POA: Diagnosis not present

## 2014-04-11 DIAGNOSIS — C50919 Malignant neoplasm of unspecified site of unspecified female breast: Secondary | ICD-10-CM

## 2014-04-11 DIAGNOSIS — T451X5A Adverse effect of antineoplastic and immunosuppressive drugs, initial encounter: Secondary | ICD-10-CM

## 2014-04-11 LAB — CBC WITH DIFFERENTIAL/PLATELET
BASO%: 0.9 % (ref 0.0–2.0)
BASOS ABS: 0.1 10*3/uL (ref 0.0–0.1)
EOS%: 1.6 % (ref 0.0–7.0)
Eosinophils Absolute: 0.2 10*3/uL (ref 0.0–0.5)
HEMATOCRIT: 45.1 % (ref 34.8–46.6)
HGB: 15 g/dL (ref 11.6–15.9)
LYMPH#: 1.8 10*3/uL (ref 0.9–3.3)
LYMPH%: 13.7 % — ABNORMAL LOW (ref 14.0–49.7)
MCH: 28.6 pg (ref 25.1–34.0)
MCHC: 33.3 g/dL (ref 31.5–36.0)
MCV: 85.9 fL (ref 79.5–101.0)
MONO#: 0.4 10*3/uL (ref 0.1–0.9)
MONO%: 3.4 % (ref 0.0–14.0)
NEUT#: 10.6 10*3/uL — ABNORMAL HIGH (ref 1.5–6.5)
NEUT%: 80.4 % — AB (ref 38.4–76.8)
Platelets: 267 10*3/uL (ref 145–400)
RBC: 5.24 10*6/uL (ref 3.70–5.45)
RDW: 13.5 % (ref 11.2–14.5)
WBC: 13.1 10*3/uL — AB (ref 3.9–10.3)

## 2014-04-11 LAB — COMPREHENSIVE METABOLIC PANEL (CC13)
ALT: 9 U/L (ref 0–55)
ANION GAP: 9 meq/L (ref 3–11)
AST: 16 U/L (ref 5–34)
Albumin: 3.7 g/dL (ref 3.5–5.0)
Alkaline Phosphatase: 93 U/L (ref 40–150)
BILIRUBIN TOTAL: 0.26 mg/dL (ref 0.20–1.20)
BUN: 7 mg/dL (ref 7.0–26.0)
CALCIUM: 9.9 mg/dL (ref 8.4–10.4)
CHLORIDE: 101 meq/L (ref 98–109)
CO2: 27 meq/L (ref 22–29)
CREATININE: 0.8 mg/dL (ref 0.6–1.1)
GLUCOSE: 109 mg/dL (ref 70–140)
Potassium: 4.1 mEq/L (ref 3.5–5.1)
Sodium: 137 mEq/L (ref 136–145)
Total Protein: 7.4 g/dL (ref 6.4–8.3)

## 2014-04-11 LAB — LACTATE DEHYDROGENASE (CC13): LDH: 173 U/L (ref 125–245)

## 2014-04-11 NOTE — Progress Notes (Signed)
[place] [facility]  [phone]) [phone] Fax:([phone]) [phone]  OFFICE PROGRESS NOTE   ID: NIKHITA MENTZEL   DOB: 1956-07-04  MR#: [id number]  VTV#:[id number]   PCP: Tamara Webb, M.D. Tamara KnudsenLennie Webb, M.D. (1999)/Tamara Webb, M.D. [id number]) & Tamara Webb, M.D. (2006) RAD ONC: Tamara Webb, M.D. NEURO: Tamara Webb, M.D.   BREAST CANCER HISTORY: From the prior summary note  Mrs. Tamara Webb herself states she felt a lump 2 years prior to her left breast cancer diagnosis in 1999.  She states she was waterskiing in 1997 and felt a "tugging" with a lump in her left breast.  Limited medical records are available from that era, so the course of her initial left breast invasive carcinoma is summarized.  She was treated with 4 cycles of neoadjuvant chemotherapy consisting of AC (Adriamycin/Cytoxan) completed in 10/1997.  She is status post left breast lumpectomy in 06/1998 for a 3 cm left breast invasive carcinoma, stage IIA, T2 N0, ER positive PR negative, with negative sentinel lymph nodes.  She is status post left breast radiation therapy that was completed in 10/1998.  This was followed by 5 years of adjuvant hormonal therapy with various antiestrogen agents.  She underwent total bilateral hysterectomy/bilateral salpingo-oophorectomy in 08/2001.    She experienced recurrent disease in the left breast in 11/2004.  She had a left breast needle core biopsy on 11/06/2004 which showed benign fibroadipose tissue consistent with scar.  She had another left breast needle core biopsy on 11/12/2004 which showed invasive mammary carcinoma.  She is status post left breast simple mastectomy and right breast simple mastectomy with left axillary sentinel node biopsy on 01/06/2005 for a left breast, stage I, pT1c, pN0(i-)(sn), pMX, 1.1 cm invasive ductal carcinoma with lobular features, grade 2, (right breast showed patchy fibrosis and ductal ectasia with no evidence of malignancy), estrogen receptor negative,  progesterone receptor negative, Ki-67 6%, HER-2/neu by FISH no amplification, with 0/1 metastatic left axillary lymph nodes.The patient began adjuvant chemotherapy with TC (Taxotere/Cytoxan) on 02/20/2005 until 04/24/2005 x 4 cycles with Neulasta support.  She underwent bilateral breast reconstructive surgery with TRAM flaps in 06/2005.  Her subsequent history is as detailed below.   INTERVAL HISTORY: Tamara Webb returns today for followup of her breast cancer accompanied by her husband Tamara Webb. From a breast cancer point of view the interval history is unremarkable. The patient continues to have chronic pain and is currently followed by Dr. Nicholaus Bloom  REVIEW OF SYSTEMS: Gen. and tells me she is doing well on the current oxycodone and gabapentin regimen. She still hurts all over, and describes the pain says stepping throbbing aching and constant. She occasionally is able to do a little bit of yard work but mostly she is disabled. She has a sinus congestion problem right now, but no oral cough fever or phlegm production. She can be short of breath at times. Sometimes her heart beat can be irregular. Sometimes she has trouble urinating she thinks secondary to some of her pain medications. She feels weak and numb. She feels anxious but not depressed. She is behind on your colonoscopy. A detailed review of systems today was otherwise noncontributory  PAST MEDICAL HISTORY: Past Medical History  Diagnosis Date  . Cancer 1999&2006    breast cancer x2   . Allergy   . Heart murmur 1999  . COPD (chronic obstructive pulmonary disease)   . Neuropathy   . Breast cancer     bilateral   . Neuropathy due to chemotherapeutic drug  breast cancer , Dr Truddie Coco.     PAST SURGICAL HISTORY: Past Surgical History  Procedure Laterality Date  . Total abdominal hysterectomy  2001  . Mastectomy  2006    bil.mastectomy with abdominal trans flap.  . Breast reconstruction    . Lymph gland excision      left arm  .  Ectopic pregnancy surgery    . Appendectomy    . Tonsillectomy  as child  . Ovary surgery      after ectopic sx  . Carpal tunnel release      right    FAMILY HISTORY Family History  Problem Relation Age of Onset  . Colon polyps Sister 3  . Diabetes Sister   . Colon cancer Neg Hx   . Diabetes Mother   . Multiple sclerosis Mother   . Heart attack Mother   . Stroke Mother   . Diabetes Father   . Heart attack Father   . Cancer Father     Kidney cancer    GYNECOLOGIC HISTORY: Gravida 2, para 0, 1 miscarriage, 1 tubal pregnancy, age of menarche 30, menopause at the time of hysterectomy in 2002.  Used birth control pills from ages 11 through 27.   SOCIAL HISTORY: Ms. is patent and her husband Darielle Hancher have been married since 21.  Both Mr. and Mrs. Antwine are on disability.  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    ADVANCED DIRECTIVES: The patient and her husband are each others healthcare powers of attorney   HEALTH MAINTENANCE: History  Substance Use Topics  . Smoking status: Current Every Day Smoker -- 1.00 packs/day    Types: Cigarettes  . Smokeless tobacco: Never Used     Comment: quit for 20 years,just started back in 2000  . Alcohol Use: No    Colonoscopy: 06/21/2012 PAP: Not on file Bone density: The patient's last bone density scan on 01/15/2010 showed a T score of -1.4 (osteopenia).  Lipid panel: Not on file   Allergies  Allergen Reactions  . Gadolinium Derivatives Shortness Of Breath and Swelling    Given for xray  . Penicillins Shortness Of Breath  . Ivp Dye [Iodinated Diagnostic Agents]   . Pepto-Bismol [Bismuth Subsalicylate] Swelling    Current Outpatient Prescriptions  Medication Sig Dispense Refill  . Cholecalciferol (VITAMIN D PO) Take 5,000 Units by mouth daily.      . cyclobenzaprine (FLEXERIL) 10 MG tablet Take 1-2 tablets (10-20 mg total) by mouth daily as needed (1-2 tabs as needed).  60 tablet  5   . gabapentin (NEURONTIN) 600 MG tablet Take 1 tablet (600 mg total) by mouth 3 (three) times daily.  270 tablet  3  . Multiple Vitamin (MULTIVITAMIN) tablet Take 1 tablet by mouth daily.      . Oxycodone HCl 10 MG TABS One po q 4 to 6 hours po .  Patient will be followed by pain clinic  90 tablet  0   No current facility-administered medications for this visit.    OBJECTIVE: Middle-aged white woman in no acute distress Filed Vitals:   04/11/14 1328  BP: 121/76  Pulse: 90  Temp: 98.7 F (37.1 C)  Resp: 18     Body mass index is 21.3 kg/(m^2).      ECOG FS: 1 - Symptomatic but completely ambulatory  Sclerae unicteric, pupils equal and reactive Oropharynx clear and slightly dry No cervical or supraclavicular adenopathy Lungs no rales or rhonchi Heart regular rate and  rhythm Abd soft, nontender, positive bowel sounds MSK  no upper extremity lymphedema Neuro: nonfocal, well oriented Breasts: Status post bilateral mastectomies. There is no evidence of local recurrence. The left axilla is benign.    LAB RESULTS: Lab Results  Component Value Date   WBC 13.1* 04/11/2014   NEUTROABS 10.6* 04/11/2014   HGB 15.0 04/11/2014   HCT 45.1 04/11/2014   MCV 85.9 04/11/2014   PLT 267 04/11/2014      Chemistry      Component Value Date/Time   NA 137 04/11/2014 1253   NA 135 02/08/2013 1348   NA 138 03/23/2012 1410   K 4.1 04/11/2014 1253   K 4.3 02/08/2013 1348   CL 97 02/08/2013 1348   CO2 27 04/11/2014 1253   CO2 28 02/08/2013 1348   BUN 7.0 04/11/2014 1253   BUN 6 02/08/2013 1348   BUN 7 03/23/2012 1410   CREATININE 0.8 04/11/2014 1253   CREATININE 0.58 02/08/2013 1348      Component Value Date/Time   CALCIUM 9.9 04/11/2014 1253   CALCIUM 10.1 02/08/2013 1348   ALKPHOS 93 04/11/2014 1253   ALKPHOS 108 02/08/2013 1348   AST 16 04/11/2014 1253   AST 21 02/08/2013 1348   ALT 9 04/11/2014 1253   ALT 18 02/08/2013 1348   BILITOT 0.26 04/11/2014 1253   BILITOT 0.3 02/08/2013 1348      Lab Results  Component Value Date    LABCA2 35 03/23/2012    Urinalysis    Component Value Date/Time   LABSPEC 1.005 08/25/2006 1207    STUDIES: No results found.  ASSESSMENT: Ms. Scroggs is a  58 y.o.  [place], [place] woman diagnosed with left-sided breast cancer in 1998  1.  She was treated with 4 cycles of neoadjuvant chemotherapy consisting of AC (Adriamycin/Cytoxan) completed in 10/1997.  2. She is status post left breast lumpectomy in 06/1998 for aT2 N0,  stage IIA  invasive carcinoma,  ER positive,  PR negative.  3. She is status post left breast radiation therapy, completed in 10/1998.   4. Radiation therapy was followed by 5 years of adjuvant hormonal therapy with various antiestrogen agents.    5.She underwent total bilateral hysterectomy/bilateral salpingo-oophorectomy in 08/2001.    6. Recurrent disease in the left breast diagnosed by core biopsy 11/12/2004 which showed invasive mammary carcinoma.  7.  Status post left breast simple mastectomy and right breast simple mastectomy with left axillary sentinel node biopsy on 01/06/2005 for a left breast, pT1c, pN0(i-)(sn), stage I invasive ductal carcinoma with lobular features, grade 2,  estrogen receptor negative, progesterone receptor negative, Ki-67 6%, HER-2/neu by FISH no amplification (right breast showed no evidence of malignancy)   8.  The patient began adjuvant chemotherapy with TC (Taxotere/Cytoxan) on 02/20/2005, completing 4 cycles   9.  Status post bilateral breast reconstructive surgery with TRAM flaps in 06/2005.  7.  Chronic pain   (followed by Dr Nicholaus Bloom.)  8.  Tobacco abuse (patient was again counseled on smoking cessation)  9.  Osteopenia   PLAN: Tamara Webb is 9 years out from her recurrent left-sided breast cancer, with no evidence of active disease. This is very favorable. We are going to see her one more time, a year from now, and likely she will graduate from followup at that time.  She has a good understanding of this  plan, and agrees with that. She knows to call for any problems that may develop that she feels may be related to  her history of breast cancer.   04/11/2014,   1:54 PMMAGRINAT,GUSTAV C, MD

## 2014-04-12 DIAGNOSIS — G620 Drug-induced polyneuropathy: Secondary | ICD-10-CM | POA: Diagnosis not present

## 2014-04-12 DIAGNOSIS — Z79899 Other long term (current) drug therapy: Secondary | ICD-10-CM | POA: Diagnosis not present

## 2014-04-12 DIAGNOSIS — G894 Chronic pain syndrome: Secondary | ICD-10-CM | POA: Diagnosis not present

## 2014-05-11 DIAGNOSIS — Z79899 Other long term (current) drug therapy: Secondary | ICD-10-CM | POA: Diagnosis not present

## 2014-05-11 DIAGNOSIS — G894 Chronic pain syndrome: Secondary | ICD-10-CM | POA: Diagnosis not present

## 2014-05-11 DIAGNOSIS — G620 Drug-induced polyneuropathy: Secondary | ICD-10-CM | POA: Diagnosis not present

## 2014-05-19 ENCOUNTER — Encounter: Payer: Self-pay | Admitting: Neurology

## 2014-06-08 DIAGNOSIS — G620 Drug-induced polyneuropathy: Secondary | ICD-10-CM | POA: Diagnosis not present

## 2014-06-08 DIAGNOSIS — Z79891 Long term (current) use of opiate analgesic: Secondary | ICD-10-CM | POA: Diagnosis not present

## 2014-06-08 DIAGNOSIS — G894 Chronic pain syndrome: Secondary | ICD-10-CM | POA: Diagnosis not present

## 2014-07-05 DIAGNOSIS — G620 Drug-induced polyneuropathy: Secondary | ICD-10-CM | POA: Diagnosis not present

## 2014-07-05 DIAGNOSIS — Z79891 Long term (current) use of opiate analgesic: Secondary | ICD-10-CM | POA: Diagnosis not present

## 2014-07-05 DIAGNOSIS — G894 Chronic pain syndrome: Secondary | ICD-10-CM | POA: Diagnosis not present

## 2014-08-02 DIAGNOSIS — Z79891 Long term (current) use of opiate analgesic: Secondary | ICD-10-CM | POA: Diagnosis not present

## 2014-08-02 DIAGNOSIS — G894 Chronic pain syndrome: Secondary | ICD-10-CM | POA: Diagnosis not present

## 2014-08-02 DIAGNOSIS — G620 Drug-induced polyneuropathy: Secondary | ICD-10-CM | POA: Diagnosis not present

## 2014-08-28 DIAGNOSIS — G894 Chronic pain syndrome: Secondary | ICD-10-CM | POA: Diagnosis not present

## 2014-08-28 DIAGNOSIS — Z79891 Long term (current) use of opiate analgesic: Secondary | ICD-10-CM | POA: Diagnosis not present

## 2014-08-28 DIAGNOSIS — G620 Drug-induced polyneuropathy: Secondary | ICD-10-CM | POA: Diagnosis not present

## 2014-08-29 ENCOUNTER — Ambulatory Visit: Payer: Medicare Other | Admitting: Neurology

## 2014-09-26 DIAGNOSIS — G620 Drug-induced polyneuropathy: Secondary | ICD-10-CM | POA: Diagnosis not present

## 2014-09-26 DIAGNOSIS — Z79891 Long term (current) use of opiate analgesic: Secondary | ICD-10-CM | POA: Diagnosis not present

## 2014-09-26 DIAGNOSIS — G894 Chronic pain syndrome: Secondary | ICD-10-CM | POA: Diagnosis not present

## 2014-11-01 DIAGNOSIS — Z79891 Long term (current) use of opiate analgesic: Secondary | ICD-10-CM | POA: Diagnosis not present

## 2014-11-01 DIAGNOSIS — G620 Drug-induced polyneuropathy: Secondary | ICD-10-CM | POA: Diagnosis not present

## 2014-11-01 DIAGNOSIS — G894 Chronic pain syndrome: Secondary | ICD-10-CM | POA: Diagnosis not present

## 2014-11-29 DIAGNOSIS — G620 Drug-induced polyneuropathy: Secondary | ICD-10-CM | POA: Diagnosis not present

## 2014-11-29 DIAGNOSIS — G894 Chronic pain syndrome: Secondary | ICD-10-CM | POA: Diagnosis not present

## 2014-11-29 DIAGNOSIS — Z79891 Long term (current) use of opiate analgesic: Secondary | ICD-10-CM | POA: Diagnosis not present

## 2014-12-27 DIAGNOSIS — G894 Chronic pain syndrome: Secondary | ICD-10-CM | POA: Diagnosis not present

## 2014-12-27 DIAGNOSIS — Z79891 Long term (current) use of opiate analgesic: Secondary | ICD-10-CM | POA: Diagnosis not present

## 2014-12-27 DIAGNOSIS — G620 Drug-induced polyneuropathy: Secondary | ICD-10-CM | POA: Diagnosis not present

## 2015-01-24 DIAGNOSIS — G894 Chronic pain syndrome: Secondary | ICD-10-CM | POA: Diagnosis not present

## 2015-01-24 DIAGNOSIS — Z79891 Long term (current) use of opiate analgesic: Secondary | ICD-10-CM | POA: Diagnosis not present

## 2015-01-24 DIAGNOSIS — G620 Drug-induced polyneuropathy: Secondary | ICD-10-CM | POA: Diagnosis not present

## 2015-02-26 DIAGNOSIS — G620 Drug-induced polyneuropathy: Secondary | ICD-10-CM | POA: Diagnosis not present

## 2015-02-26 DIAGNOSIS — G894 Chronic pain syndrome: Secondary | ICD-10-CM | POA: Diagnosis not present

## 2015-02-26 DIAGNOSIS — Z79891 Long term (current) use of opiate analgesic: Secondary | ICD-10-CM | POA: Diagnosis not present

## 2015-03-20 ENCOUNTER — Encounter: Payer: Self-pay | Admitting: Genetic Counselor

## 2015-03-28 DIAGNOSIS — G894 Chronic pain syndrome: Secondary | ICD-10-CM | POA: Diagnosis not present

## 2015-03-28 DIAGNOSIS — Z79891 Long term (current) use of opiate analgesic: Secondary | ICD-10-CM | POA: Diagnosis not present

## 2015-03-28 DIAGNOSIS — G620 Drug-induced polyneuropathy: Secondary | ICD-10-CM | POA: Diagnosis not present

## 2015-04-18 ENCOUNTER — Other Ambulatory Visit: Payer: Self-pay | Admitting: *Deleted

## 2015-04-18 DIAGNOSIS — C50912 Malignant neoplasm of unspecified site of left female breast: Secondary | ICD-10-CM

## 2015-04-19 ENCOUNTER — Other Ambulatory Visit (HOSPITAL_BASED_OUTPATIENT_CLINIC_OR_DEPARTMENT_OTHER): Payer: Medicare Other

## 2015-04-19 ENCOUNTER — Encounter (INDEPENDENT_AMBULATORY_CARE_PROVIDER_SITE_OTHER): Payer: Self-pay

## 2015-04-19 DIAGNOSIS — Z853 Personal history of malignant neoplasm of breast: Secondary | ICD-10-CM | POA: Diagnosis present

## 2015-04-19 DIAGNOSIS — C50912 Malignant neoplasm of unspecified site of left female breast: Secondary | ICD-10-CM

## 2015-04-19 LAB — CBC WITH DIFFERENTIAL/PLATELET
BASO%: 0.4 % (ref 0.0–2.0)
Basophils Absolute: 0.1 10*3/uL (ref 0.0–0.1)
EOS ABS: 0.4 10*3/uL (ref 0.0–0.5)
EOS%: 2.9 % (ref 0.0–7.0)
HCT: 40.7 % (ref 34.8–46.6)
HGB: 14.1 g/dL (ref 11.6–15.9)
LYMPH%: 20.2 % (ref 14.0–49.7)
MCH: 29.6 pg (ref 25.1–34.0)
MCHC: 34.6 g/dL (ref 31.5–36.0)
MCV: 85.5 fL (ref 79.5–101.0)
MONO#: 0.5 10*3/uL (ref 0.1–0.9)
MONO%: 3.4 % (ref 0.0–14.0)
NEUT#: 9.6 10*3/uL — ABNORMAL HIGH (ref 1.5–6.5)
NEUT%: 73.1 % (ref 38.4–76.8)
Platelets: 249 10*3/uL (ref 145–400)
RBC: 4.76 10*6/uL (ref 3.70–5.45)
RDW: 12.8 % (ref 11.2–14.5)
WBC: 13.1 10*3/uL — ABNORMAL HIGH (ref 3.9–10.3)
lymph#: 2.6 10*3/uL (ref 0.9–3.3)

## 2015-04-19 LAB — COMPREHENSIVE METABOLIC PANEL (CC13)
ALK PHOS: 79 U/L (ref 40–150)
ALT: 9 U/L (ref 0–55)
AST: 14 U/L (ref 5–34)
Albumin: 3.8 g/dL (ref 3.5–5.0)
Anion Gap: 5 mEq/L (ref 3–11)
BUN: 7.2 mg/dL (ref 7.0–26.0)
CO2: 29 meq/L (ref 22–29)
CREATININE: 0.8 mg/dL (ref 0.6–1.1)
Calcium: 9.4 mg/dL (ref 8.4–10.4)
Chloride: 100 mEq/L (ref 98–109)
EGFR: 84 mL/min/{1.73_m2} — ABNORMAL LOW (ref >90)
Glucose: 137 mg/dl (ref 70–140)
Potassium: 4.9 mEq/L (ref 3.5–5.1)
Sodium: 134 mEq/L — ABNORMAL LOW (ref 136–145)
TOTAL PROTEIN: 6.9 g/dL (ref 6.4–8.3)
Total Bilirubin: 0.24 mg/dL (ref 0.20–1.20)

## 2015-04-25 DIAGNOSIS — G894 Chronic pain syndrome: Secondary | ICD-10-CM | POA: Diagnosis not present

## 2015-04-25 DIAGNOSIS — Z79891 Long term (current) use of opiate analgesic: Secondary | ICD-10-CM | POA: Diagnosis not present

## 2015-04-25 DIAGNOSIS — G620 Drug-induced polyneuropathy: Secondary | ICD-10-CM | POA: Diagnosis not present

## 2015-04-26 ENCOUNTER — Encounter: Payer: Self-pay | Admitting: Oncology

## 2015-04-26 ENCOUNTER — Ambulatory Visit (HOSPITAL_BASED_OUTPATIENT_CLINIC_OR_DEPARTMENT_OTHER): Payer: Medicare Other | Admitting: Oncology

## 2015-04-26 VITALS — BP 148/83 | HR 90 | Temp 98.8°F | Resp 18 | Ht 65.0 in | Wt 126.5 lb

## 2015-04-26 DIAGNOSIS — T451X5A Adverse effect of antineoplastic and immunosuppressive drugs, initial encounter: Secondary | ICD-10-CM

## 2015-04-26 DIAGNOSIS — Z853 Personal history of malignant neoplasm of breast: Secondary | ICD-10-CM | POA: Diagnosis not present

## 2015-04-26 DIAGNOSIS — Z720 Tobacco use: Secondary | ICD-10-CM | POA: Diagnosis not present

## 2015-04-26 DIAGNOSIS — G620 Drug-induced polyneuropathy: Secondary | ICD-10-CM

## 2015-04-26 DIAGNOSIS — C50912 Malignant neoplasm of unspecified site of left female breast: Secondary | ICD-10-CM

## 2015-04-26 NOTE — Progress Notes (Signed)
[place]  Telephone:(336) [phone] Fax:(336) [phone]  OFFICE PROGRESS NOTE   ID: ZITLALI PRIMM   DOB: [birth year]  MR#: [id number]  UMP#:[id number]   Sherian Maroon, MD  SU: Lennie Hummer, M.D. (1999)/Christian Margot Chimes, M.D. [phone]) & Crissie Reese, M.D. (2006) RAD ONC: Shelby Dubin, M.D. NEURO: Larey Seat, M.D., Nicholaus Bloom MD   BREAST CANCER HISTORY: From the prior summary note  Mrs. Vara herself states she felt a lump 2 years prior to her left breast cancer diagnosis in 1999.  She states she was waterskiing in 1997 and felt a "tugging" with a lump in her left breast.  Limited medical records are available from that era, so the course of her initial left breast invasive carcinoma is summarized.  She was treated with 4 cycles of neoadjuvant chemotherapy consisting of AC (Adriamycin/Cytoxan) completed in 10/1997.  She is status post left breast lumpectomy in 06/1998 for a 3 cm left breast invasive carcinoma, stage IIA, T2 N0, ER positive PR negative, with negative sentinel lymph nodes.  She is status post left breast radiation therapy that was completed in 10/1998.  This was followed by 5 years of adjuvant hormonal therapy with various antiestrogen agents.  She underwent total bilateral hysterectomy/bilateral salpingo-oophorectomy in 08/2001.    She experienced recurrent disease in the left breast in 11/2004.  She had a left breast needle core biopsy on 11/06/2004 which showed benign fibroadipose tissue consistent with scar.  She had another left breast needle core biopsy on 11/12/2004 which showed invasive mammary carcinoma.  She is status post left breast simple mastectomy and right breast simple mastectomy with left axillary sentinel node biopsy on 01/06/2005 for a left breast, stage I, pT1c, pN0(i-)(sn), pMX, 1.1 cm invasive ductal carcinoma with lobular features, grade 2, (right breast showed patchy fibrosis and ductal ectasia with no evidence of malignancy),  estrogen receptor negative, progesterone receptor negative, Ki-67 6%, HER-2/neu by FISH no amplification, with 0/1 metastatic left axillary lymph nodes.The patient began adjuvant chemotherapy with TC (Taxotere/Cytoxan) on 02/20/2005 until 04/24/2005 x 4 cycles with Neulasta support.  She underwent bilateral breast reconstructive surgery with TRAM flaps in 06/2005.  Her subsequent history is as detailed below.   INTERVAL HISTORY: [name] returns today for followup of her breast cancer. Interval history is generally stable. She is of course disabled because of her chronic pain problems but is doing much better under Dr. Hardin Negus direction. Unfortunately she is not able to afford the long-acting medications so she is back on the short acting which are not quite as good for her.   REVIEW OF SYSTEMS:  aside from the pain issues she complains of insomnia, severe fatigue, problems with  Blurred vision , some shortness of breath at times, she thinks because of the heat; some difficulty urinating. She has some abnormal moles on her back which she wanted me to look at today. She has some peripheral neuropathy. She feels anxious but denies depression.  She still smokes about a pack per day.A detailed review of systems today was otherwise stable.  PAST MEDICAL HISTORY: Past Medical History  Diagnosis Date  . Cancer 1999&2006    breast cancer x2   . Allergy   . Heart murmur 1999  . COPD (chronic obstructive pulmonary disease)   . Neuropathy   . Breast cancer     bilateral   . Neuropathy due to chemotherapeutic drug     breast cancer , Dr Truddie Coco.     PAST SURGICAL HISTORY: Past Surgical History  Procedure Laterality Date  . Total abdominal hysterectomy  2001  . Mastectomy  2006    bil.mastectomy with abdominal trans flap.  . Breast reconstruction    . Lymph gland excision      left arm  . Ectopic pregnancy surgery    . Appendectomy    . Tonsillectomy  as child  . Ovary surgery      after  ectopic sx  . Carpal tunnel release      right    FAMILY HISTORY Family History  Problem Relation Age of Onset  . Colon polyps Sister 63  . Diabetes Sister   . Colon cancer Neg Hx   . Diabetes Mother   . Multiple sclerosis Mother   . Heart attack Mother   . Stroke Mother   . Diabetes Father   . Heart attack Father   . Cancer Father     Kidney cancer    GYNECOLOGIC HISTORY: Gravida 2, para 0, 1 miscarriage, 1 tubal pregnancy, age of menarche 49, menopause at the time of hysterectomy in 2002.  Used birth control pills from ages 42 through 60.   SOCIAL HISTORY: Ms. is patent and her husband Krupa Stege have been married since 62.  Both Mr. and Mrs. Carre are on disability.  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    ADVANCED DIRECTIVES: The patient and her husband are each others healthcare powers of attorney   HEALTH MAINTENANCE: Social History  Substance Use Topics  . Smoking status: Current Every Day Smoker -- 1.00 packs/day    Types: Cigarettes  . Smokeless tobacco: Never Used     Comment: quit for 20 years,just started back in 2000  . Alcohol Use: No    Colonoscopy: 06/21/2012 PAP: Not on file Bone density: The patient's last bone density scan on 01/15/2010 showed a T score of -1.4 (osteopenia).  Lipid panel: Not on file   Allergies  Allergen Reactions  . Gadolinium Derivatives Shortness Of Breath and Swelling    Given for xray  . Penicillins Shortness Of Breath  . Ivp Dye [Iodinated Diagnostic Agents]   . Pepto-Bismol [Bismuth Subsalicylate] Swelling    Current Outpatient Prescriptions  Medication Sig Dispense Refill  . Cholecalciferol (VITAMIN D PO) Take 5,000 Units by mouth daily.    . cyclobenzaprine (FLEXERIL) 10 MG tablet Take 1-2 tablets (10-20 mg total) by mouth daily as needed (1-2 tabs as needed). 60 tablet 5  . gabapentin (NEURONTIN) 600 MG tablet Take 1 tablet (600 mg total) by mouth 3 (three) times daily.  270 tablet 3  . Multiple Vitamin (MULTIVITAMIN) tablet Take 1 tablet by mouth daily.    . Oxycodone HCl 10 MG TABS One po q 4 to 6 hours po .  Patient will be followed by pain clinic 90 tablet 0   No current facility-administered medications for this visit.    OBJECTIVE: Middle-aged white woman in no acute distress  Filed Vitals:   04/26/15 1349  BP: 148/83  Pulse: 90  Temp: 98.8 F (37.1 C)  Resp: 18     Body mass index is 21.05 kg/(m^2).      ECOG FS: 2 - Symptomatic, <50% confined to bed  Sclerae unicteric, EOMs intact Oropharynx clear, dentition in Poor repair No cervical or supraclavicular adenopathy Lungs no rales or rhonchi Heart regular rate and rhythm Abd soft, nontender, positive bowel sounds MSK no focal spinal tenderness, no upper extremity lymphedema Neuro: nonfocal, well oriented, flat affect Breasts: status  post bilateral mastectomies. There is no evidence of local recurrence. Both axillae are benign.    LAB RESULTS: Lab Results  Component Value Date   WBC 13.1* 04/19/2015   NEUTROABS 9.6* 04/19/2015   HGB 14.1 04/19/2015   HCT 40.7 04/19/2015   MCV 85.5 04/19/2015   PLT 249 04/19/2015      Chemistry      Component Value Date/Time   NA 134* 04/19/2015 1308   NA 135 02/08/2013 1348   NA 138 03/23/2012 1410   K 4.9 04/19/2015 1308   K 4.3 02/08/2013 1348   CL 97 02/08/2013 1348   CO2 29 04/19/2015 1308   CO2 28 02/08/2013 1348   BUN 7.2 04/19/2015 1308   BUN 6 02/08/2013 1348   BUN 7 03/23/2012 1410   CREATININE 0.8 04/19/2015 1308   CREATININE 0.58 02/08/2013 1348      Component Value Date/Time   CALCIUM 9.4 04/19/2015 1308   CALCIUM 10.1 02/08/2013 1348   ALKPHOS 79 04/19/2015 1308   ALKPHOS 108 02/08/2013 1348   AST 14 04/19/2015 1308   AST 21 02/08/2013 1348   ALT 9 04/19/2015 1308   ALT 18 02/08/2013 1348   BILITOT 0.24 04/19/2015 1308   BILITOT 0.3 02/08/2013 1348      Lab Results  Component Value Date   LABCA2 35  03/23/2012    Urinalysis    Component Value Date/Time   LABSPEC 1.005 08/25/2006 1207   PHURINE 6.5 08/25/2006 1207   HGBUR Negative 08/25/2006 1207   BILIRUBINUR Negative 08/25/2006 1207   KETONESUR Negative 08/25/2006 1207   PROTEINUR Negative 08/25/2006 1207   NITRITE Negative 08/25/2006 1207   LEUKOCYTESUR Negative 08/25/2006 1207    STUDIES: No results found.   ASSESSMENT: Ms. Wardell is a  59 y.o.  [place], [place] woman diagnosed with left-sided breast cancer in 1998  1.  She was treated with 4 cycles of neoadjuvant chemotherapy consisting of AC (Adriamycin/Cytoxan) completed in 10/1997.  2. She is status post left breast lumpectomy in 06/1998 for aT2 N0,  stage IIA  invasive carcinoma,  ER positive,  PR negative.  3. She is status post left breast radiation therapy, completed in 10/1998.   4. Radiation therapy was followed by 5 years of adjuvant hormonal therapy with various antiestrogen agents.    5.She underwent total bilateral hysterectomy/bilateral salpingo-oophorectomy in 08/2001.    6. Recurrent disease in the left breast diagnosed by core biopsy 11/12/2004 which showed invasive mammary carcinoma.  7.  Status post left breast simple mastectomy and right breast simple mastectomy with left axillary sentinel node biopsy on 01/06/2005 for a left breast, pT1c, pN0(i-)(sn), stage I invasive ductal carcinoma with lobular features, grade 2,  estrogen receptor negative, progesterone receptor negative, Ki-67 6%, HER-2/neu by FISH no amplification (right breast showed no evidence of malignancy)   8.  The patient began adjuvant chemotherapy with TC (Taxotere/Cytoxan) on 02/20/2005, completing 4 cycles   9.  Status post bilateral breast reconstructive surgery with TRAM flaps in 06/2005.  7.  Chronic pain   (followed by Dr Nicholaus Bloom.)  8.  Tobacco abuse (patient was again counseled on smoking cessation)  9.  Osteopenia,    PLAN: Tyeasha is now 10 years out from  her definitive surgery with no evidence of disease recurrence. We reviewed the fact that estrogen receptor negative patient's if they're going to recur tend to recur early. Accordingly I am comfortable releasing her to her primary care physician at this point.  We  did discuss the survivorship program. I think she would be a very good candidate for it. She would like to continue to be seen here on a yearly basis and likes the idea of having lab work checked because it is also reassuring to her. We are putting those orders in for her.  Nakya knows that I will be glad to see heart any point in the future if and when the need arises, but as of now I am making her no further routine appointment with me here  04/26/2015,   1:55 PMMAGRINAT,Cordarious Zeek C, MD

## 2015-04-27 ENCOUNTER — Telehealth: Payer: Self-pay | Admitting: Oncology

## 2015-04-27 NOTE — Telephone Encounter (Signed)
Sent message to schedule for BSNP one year with labs one week before.

## 2015-05-23 DIAGNOSIS — G894 Chronic pain syndrome: Secondary | ICD-10-CM | POA: Diagnosis not present

## 2015-05-23 DIAGNOSIS — G620 Drug-induced polyneuropathy: Secondary | ICD-10-CM | POA: Diagnosis not present

## 2015-05-23 DIAGNOSIS — Z79891 Long term (current) use of opiate analgesic: Secondary | ICD-10-CM | POA: Diagnosis not present

## 2015-06-20 DIAGNOSIS — M5441 Lumbago with sciatica, right side: Secondary | ICD-10-CM | POA: Diagnosis not present

## 2015-06-20 DIAGNOSIS — Z79891 Long term (current) use of opiate analgesic: Secondary | ICD-10-CM | POA: Diagnosis not present

## 2015-06-20 DIAGNOSIS — G620 Drug-induced polyneuropathy: Secondary | ICD-10-CM | POA: Diagnosis not present

## 2015-06-20 DIAGNOSIS — G894 Chronic pain syndrome: Secondary | ICD-10-CM | POA: Diagnosis not present

## 2015-07-18 DIAGNOSIS — Z79891 Long term (current) use of opiate analgesic: Secondary | ICD-10-CM | POA: Diagnosis not present

## 2015-07-18 DIAGNOSIS — G620 Drug-induced polyneuropathy: Secondary | ICD-10-CM | POA: Diagnosis not present

## 2015-07-18 DIAGNOSIS — G894 Chronic pain syndrome: Secondary | ICD-10-CM | POA: Diagnosis not present

## 2015-07-18 DIAGNOSIS — M5441 Lumbago with sciatica, right side: Secondary | ICD-10-CM | POA: Diagnosis not present

## 2015-08-15 DIAGNOSIS — Z79891 Long term (current) use of opiate analgesic: Secondary | ICD-10-CM | POA: Diagnosis not present

## 2015-08-15 DIAGNOSIS — M5441 Lumbago with sciatica, right side: Secondary | ICD-10-CM | POA: Diagnosis not present

## 2015-08-15 DIAGNOSIS — G894 Chronic pain syndrome: Secondary | ICD-10-CM | POA: Diagnosis not present

## 2015-08-15 DIAGNOSIS — G620 Drug-induced polyneuropathy: Secondary | ICD-10-CM | POA: Diagnosis not present

## 2015-09-12 DIAGNOSIS — Z79891 Long term (current) use of opiate analgesic: Secondary | ICD-10-CM | POA: Diagnosis not present

## 2015-09-12 DIAGNOSIS — M791 Myalgia: Secondary | ICD-10-CM | POA: Diagnosis not present

## 2015-09-12 DIAGNOSIS — G620 Drug-induced polyneuropathy: Secondary | ICD-10-CM | POA: Diagnosis not present

## 2015-09-12 DIAGNOSIS — G894 Chronic pain syndrome: Secondary | ICD-10-CM | POA: Diagnosis not present

## 2015-10-10 DIAGNOSIS — Z79891 Long term (current) use of opiate analgesic: Secondary | ICD-10-CM | POA: Diagnosis not present

## 2015-10-10 DIAGNOSIS — M791 Myalgia: Secondary | ICD-10-CM | POA: Diagnosis not present

## 2015-10-10 DIAGNOSIS — G894 Chronic pain syndrome: Secondary | ICD-10-CM | POA: Diagnosis not present

## 2015-10-10 DIAGNOSIS — G620 Drug-induced polyneuropathy: Secondary | ICD-10-CM | POA: Diagnosis not present

## 2015-11-07 DIAGNOSIS — G620 Drug-induced polyneuropathy: Secondary | ICD-10-CM | POA: Diagnosis not present

## 2015-11-07 DIAGNOSIS — M791 Myalgia: Secondary | ICD-10-CM | POA: Diagnosis not present

## 2015-11-07 DIAGNOSIS — G894 Chronic pain syndrome: Secondary | ICD-10-CM | POA: Diagnosis not present

## 2015-11-07 DIAGNOSIS — Z79891 Long term (current) use of opiate analgesic: Secondary | ICD-10-CM | POA: Diagnosis not present

## 2015-12-05 DIAGNOSIS — M791 Myalgia: Secondary | ICD-10-CM | POA: Diagnosis not present

## 2015-12-05 DIAGNOSIS — Z79891 Long term (current) use of opiate analgesic: Secondary | ICD-10-CM | POA: Diagnosis not present

## 2015-12-05 DIAGNOSIS — G620 Drug-induced polyneuropathy: Secondary | ICD-10-CM | POA: Diagnosis not present

## 2015-12-05 DIAGNOSIS — G894 Chronic pain syndrome: Secondary | ICD-10-CM | POA: Diagnosis not present

## 2016-01-02 DIAGNOSIS — G894 Chronic pain syndrome: Secondary | ICD-10-CM | POA: Diagnosis not present

## 2016-01-02 DIAGNOSIS — Z79891 Long term (current) use of opiate analgesic: Secondary | ICD-10-CM | POA: Diagnosis not present

## 2016-01-02 DIAGNOSIS — M791 Myalgia: Secondary | ICD-10-CM | POA: Diagnosis not present

## 2016-01-02 DIAGNOSIS — G620 Drug-induced polyneuropathy: Secondary | ICD-10-CM | POA: Diagnosis not present

## 2016-01-30 DIAGNOSIS — G894 Chronic pain syndrome: Secondary | ICD-10-CM | POA: Diagnosis not present

## 2016-01-30 DIAGNOSIS — G620 Drug-induced polyneuropathy: Secondary | ICD-10-CM | POA: Diagnosis not present

## 2016-01-30 DIAGNOSIS — M791 Myalgia: Secondary | ICD-10-CM | POA: Diagnosis not present

## 2016-01-30 DIAGNOSIS — Z79891 Long term (current) use of opiate analgesic: Secondary | ICD-10-CM | POA: Diagnosis not present

## 2016-02-27 DIAGNOSIS — G894 Chronic pain syndrome: Secondary | ICD-10-CM | POA: Diagnosis not present

## 2016-02-27 DIAGNOSIS — M791 Myalgia: Secondary | ICD-10-CM | POA: Diagnosis not present

## 2016-02-27 DIAGNOSIS — G620 Drug-induced polyneuropathy: Secondary | ICD-10-CM | POA: Diagnosis not present

## 2016-02-27 DIAGNOSIS — Z79891 Long term (current) use of opiate analgesic: Secondary | ICD-10-CM | POA: Diagnosis not present

## 2016-03-26 DIAGNOSIS — M791 Myalgia: Secondary | ICD-10-CM | POA: Diagnosis not present

## 2016-03-26 DIAGNOSIS — Z79891 Long term (current) use of opiate analgesic: Secondary | ICD-10-CM | POA: Diagnosis not present

## 2016-03-26 DIAGNOSIS — G620 Drug-induced polyneuropathy: Secondary | ICD-10-CM | POA: Diagnosis not present

## 2016-03-26 DIAGNOSIS — G894 Chronic pain syndrome: Secondary | ICD-10-CM | POA: Diagnosis not present

## 2016-04-23 DIAGNOSIS — G894 Chronic pain syndrome: Secondary | ICD-10-CM | POA: Diagnosis not present

## 2016-04-23 DIAGNOSIS — Z79891 Long term (current) use of opiate analgesic: Secondary | ICD-10-CM | POA: Diagnosis not present

## 2016-04-23 DIAGNOSIS — M791 Myalgia: Secondary | ICD-10-CM | POA: Diagnosis not present

## 2016-04-23 DIAGNOSIS — G620 Drug-induced polyneuropathy: Secondary | ICD-10-CM | POA: Diagnosis not present

## 2016-05-05 DIAGNOSIS — K029 Dental caries, unspecified: Secondary | ICD-10-CM | POA: Diagnosis not present

## 2016-05-21 DIAGNOSIS — M791 Myalgia: Secondary | ICD-10-CM | POA: Diagnosis not present

## 2016-05-21 DIAGNOSIS — Z79891 Long term (current) use of opiate analgesic: Secondary | ICD-10-CM | POA: Diagnosis not present

## 2016-05-21 DIAGNOSIS — G620 Drug-induced polyneuropathy: Secondary | ICD-10-CM | POA: Diagnosis not present

## 2016-05-21 DIAGNOSIS — G894 Chronic pain syndrome: Secondary | ICD-10-CM | POA: Diagnosis not present

## 2016-06-18 DIAGNOSIS — G894 Chronic pain syndrome: Secondary | ICD-10-CM | POA: Diagnosis not present

## 2016-06-18 DIAGNOSIS — G620 Drug-induced polyneuropathy: Secondary | ICD-10-CM | POA: Diagnosis not present

## 2016-06-18 DIAGNOSIS — Z79891 Long term (current) use of opiate analgesic: Secondary | ICD-10-CM | POA: Diagnosis not present

## 2016-06-18 DIAGNOSIS — M791 Myalgia: Secondary | ICD-10-CM | POA: Diagnosis not present

## 2016-07-16 DIAGNOSIS — G894 Chronic pain syndrome: Secondary | ICD-10-CM | POA: Diagnosis not present

## 2016-07-16 DIAGNOSIS — Z79891 Long term (current) use of opiate analgesic: Secondary | ICD-10-CM | POA: Diagnosis not present

## 2016-07-16 DIAGNOSIS — M791 Myalgia: Secondary | ICD-10-CM | POA: Diagnosis not present

## 2016-07-16 DIAGNOSIS — G620 Drug-induced polyneuropathy: Secondary | ICD-10-CM | POA: Diagnosis not present

## 2016-08-14 DIAGNOSIS — G620 Drug-induced polyneuropathy: Secondary | ICD-10-CM | POA: Diagnosis not present

## 2016-08-14 DIAGNOSIS — M791 Myalgia: Secondary | ICD-10-CM | POA: Diagnosis not present

## 2016-08-14 DIAGNOSIS — G894 Chronic pain syndrome: Secondary | ICD-10-CM | POA: Diagnosis not present

## 2016-08-14 DIAGNOSIS — Z79891 Long term (current) use of opiate analgesic: Secondary | ICD-10-CM | POA: Diagnosis not present

## 2016-09-17 DIAGNOSIS — G894 Chronic pain syndrome: Secondary | ICD-10-CM | POA: Diagnosis not present

## 2016-09-17 DIAGNOSIS — M791 Myalgia: Secondary | ICD-10-CM | POA: Diagnosis not present

## 2016-09-17 DIAGNOSIS — G620 Drug-induced polyneuropathy: Secondary | ICD-10-CM | POA: Diagnosis not present

## 2016-09-17 DIAGNOSIS — Z79891 Long term (current) use of opiate analgesic: Secondary | ICD-10-CM | POA: Diagnosis not present

## 2016-10-10 DIAGNOSIS — G620 Drug-induced polyneuropathy: Secondary | ICD-10-CM | POA: Diagnosis not present

## 2016-10-10 DIAGNOSIS — M791 Myalgia: Secondary | ICD-10-CM | POA: Diagnosis not present

## 2016-10-10 DIAGNOSIS — G894 Chronic pain syndrome: Secondary | ICD-10-CM | POA: Diagnosis not present

## 2016-10-10 DIAGNOSIS — Z79891 Long term (current) use of opiate analgesic: Secondary | ICD-10-CM | POA: Diagnosis not present

## 2016-11-07 DIAGNOSIS — G894 Chronic pain syndrome: Secondary | ICD-10-CM | POA: Diagnosis not present

## 2016-11-07 DIAGNOSIS — M791 Myalgia: Secondary | ICD-10-CM | POA: Diagnosis not present

## 2016-11-07 DIAGNOSIS — G620 Drug-induced polyneuropathy: Secondary | ICD-10-CM | POA: Diagnosis not present

## 2016-11-07 DIAGNOSIS — Z79891 Long term (current) use of opiate analgesic: Secondary | ICD-10-CM | POA: Diagnosis not present

## 2016-12-04 DIAGNOSIS — Z79891 Long term (current) use of opiate analgesic: Secondary | ICD-10-CM | POA: Diagnosis not present

## 2016-12-04 DIAGNOSIS — G620 Drug-induced polyneuropathy: Secondary | ICD-10-CM | POA: Diagnosis not present

## 2016-12-04 DIAGNOSIS — M791 Myalgia: Secondary | ICD-10-CM | POA: Diagnosis not present

## 2016-12-04 DIAGNOSIS — G894 Chronic pain syndrome: Secondary | ICD-10-CM | POA: Diagnosis not present

## 2017-01-01 DIAGNOSIS — Z79891 Long term (current) use of opiate analgesic: Secondary | ICD-10-CM | POA: Diagnosis not present

## 2017-01-01 DIAGNOSIS — G620 Drug-induced polyneuropathy: Secondary | ICD-10-CM | POA: Diagnosis not present

## 2017-01-01 DIAGNOSIS — G894 Chronic pain syndrome: Secondary | ICD-10-CM | POA: Diagnosis not present

## 2017-01-01 DIAGNOSIS — M791 Myalgia: Secondary | ICD-10-CM | POA: Diagnosis not present

## 2017-01-15 ENCOUNTER — Telehealth: Payer: Self-pay

## 2017-01-15 NOTE — Telephone Encounter (Signed)
Pt  Called requesting appt for survivorship f/u and lab work. MSG sent to scheduling dept

## 2017-01-28 DIAGNOSIS — G894 Chronic pain syndrome: Secondary | ICD-10-CM | POA: Diagnosis not present

## 2017-01-28 DIAGNOSIS — Z79891 Long term (current) use of opiate analgesic: Secondary | ICD-10-CM | POA: Diagnosis not present

## 2017-01-28 DIAGNOSIS — G620 Drug-induced polyneuropathy: Secondary | ICD-10-CM | POA: Diagnosis not present

## 2017-01-28 DIAGNOSIS — M791 Myalgia: Secondary | ICD-10-CM | POA: Diagnosis not present

## 2017-02-11 ENCOUNTER — Other Ambulatory Visit: Payer: Self-pay

## 2017-02-11 DIAGNOSIS — C50912 Malignant neoplasm of unspecified site of left female breast: Secondary | ICD-10-CM

## 2017-02-12 ENCOUNTER — Telehealth: Payer: Self-pay | Admitting: *Deleted

## 2017-02-12 ENCOUNTER — Ambulatory Visit (HOSPITAL_BASED_OUTPATIENT_CLINIC_OR_DEPARTMENT_OTHER): Payer: Medicare Other | Admitting: Adult Health

## 2017-02-12 ENCOUNTER — Other Ambulatory Visit (HOSPITAL_BASED_OUTPATIENT_CLINIC_OR_DEPARTMENT_OTHER): Payer: Medicare Other

## 2017-02-12 ENCOUNTER — Encounter: Payer: Self-pay | Admitting: *Deleted

## 2017-02-12 ENCOUNTER — Encounter: Payer: Self-pay | Admitting: Adult Health

## 2017-02-12 ENCOUNTER — Ambulatory Visit (HOSPITAL_COMMUNITY)
Admission: RE | Admit: 2017-02-12 | Discharge: 2017-02-12 | Disposition: A | Discharge status: Home / Self Care | Payer: Medicare Other | Source: Ambulatory Visit | Attending: Adult Health | Admitting: Adult Health

## 2017-02-12 VITALS — BP 151/79 | HR 86 | Temp 98.3°F | Resp 18 | Ht 65.0 in | Wt 127.1 lb

## 2017-02-12 DIAGNOSIS — C50912 Malignant neoplasm of unspecified site of left female breast: Secondary | ICD-10-CM

## 2017-02-12 DIAGNOSIS — R079 Chest pain, unspecified: Secondary | ICD-10-CM | POA: Insufficient documentation

## 2017-02-12 DIAGNOSIS — N6332 Unspecified lump in axillary tail of the left breast: Secondary | ICD-10-CM

## 2017-02-12 DIAGNOSIS — Z853 Personal history of malignant neoplasm of breast: Secondary | ICD-10-CM

## 2017-02-12 DIAGNOSIS — Z9889 Other specified postprocedural states: Secondary | ICD-10-CM | POA: Insufficient documentation

## 2017-02-12 LAB — COMPREHENSIVE METABOLIC PANEL
ALBUMIN: 3.8 g/dL (ref 3.5–5.0)
ALK PHOS: 97 U/L (ref 40–150)
ALT: 13 U/L (ref 0–55)
ANION GAP: 8 meq/L (ref 3–11)
AST: 18 U/L (ref 5–34)
BUN: 9.3 mg/dL (ref 7.0–26.0)
CO2: 28 meq/L (ref 22–29)
Calcium: 9.7 mg/dL (ref 8.4–10.4)
Chloride: 100 mEq/L (ref 98–109)
Creatinine: 0.7 mg/dL (ref 0.6–1.1)
GLUCOSE: 79 mg/dL (ref 70–140)
Potassium: 3.9 mEq/L (ref 3.5–5.1)
SODIUM: 137 meq/L (ref 136–145)
TOTAL PROTEIN: 7.2 g/dL (ref 6.4–8.3)

## 2017-02-12 LAB — CBC WITH DIFFERENTIAL/PLATELET
BASO%: 0.8 % (ref 0.0–2.0)
BASOS ABS: 0.1 10*3/uL (ref 0.0–0.1)
EOS ABS: 0.3 10*3/uL (ref 0.0–0.5)
EOS%: 2.6 % (ref 0.0–7.0)
HCT: 41.6 % (ref 34.8–46.6)
HGB: 14.2 g/dL (ref 11.6–15.9)
LYMPH%: 31.6 % (ref 14.0–49.7)
MCH: 29.9 pg (ref 25.1–34.0)
MCHC: 34.2 g/dL (ref 31.5–36.0)
MCV: 87.3 fL (ref 79.5–101.0)
MONO#: 0.7 10*3/uL (ref 0.1–0.9)
MONO%: 7 % (ref 0.0–14.0)
NEUT#: 6.2 10*3/uL (ref 1.5–6.5)
NEUT%: 58 % (ref 38.4–76.8)
PLATELETS: 245 10*3/uL (ref 145–400)
RBC: 4.76 10*6/uL (ref 3.70–5.45)
RDW: 14 % (ref 11.2–14.5)
WBC: 10.7 10*3/uL — ABNORMAL HIGH (ref 3.9–10.3)
lymph#: 3.4 10*3/uL — ABNORMAL HIGH (ref 0.9–3.3)

## 2017-02-12 NOTE — Progress Notes (Signed)
Per Mendel Ryder, NP scheduled patient for an U/S at the [facility]. On Friday 02/20/17 at 1:00 pm- patient given information while in the office, and understanding verbalized.

## 2017-02-12 NOTE — Progress Notes (Signed)
CLINIC:  Survivorship   REASON FOR VISIT:  Routine follow-up for history of breast cancer.   BRIEF ONCOLOGIC HISTORY:  Per Dr Jana Hakim last note from 04/16/2015:   Tamara Webb is a  61 y.o.  [place], [place] woman diagnosed with left-sided breast cancer in 1998  1.  She was treated with 4 cycles of neoadjuvant chemotherapy consisting of AC (Adriamycin/Cytoxan) completed in 10/1997.  2. She is status post left breast lumpectomy in 06/1998 for aT2 N0,  stage IIA  invasive carcinoma,  ER positive,  PR negative.  3. She is status post left breast radiation therapy, completed in 10/1998.   4. Radiation therapy was followed by 5 years of adjuvant hormonal therapy with various antiestrogen agents.    5.She underwent total bilateral hysterectomy/bilateral salpingo-oophorectomy in 08/2001.    6. Recurrent disease in the left breast diagnosed by core biopsy 11/12/2004 which showed invasive mammary carcinoma.  7.  Status post left breast simple mastectomy and right breast simple mastectomy with left axillary sentinel node biopsy on 01/06/2005 for a left breast, pT1c, pN0(i-)(sn), stage I invasive ductal carcinoma with lobular features, grade 2,  estrogen receptor negative, progesterone receptor negative, Ki-67 6%, HER-2/neu by FISH no amplification (right breast showed no evidence of malignancy)   8.  The patient began adjuvant chemotherapy with TC (Taxotere/Cytoxan) on 02/20/2005, completing 4 cycles   9.  Status post bilateral breast reconstructive surgery with TRAM flaps in 06/2005.  7.  Chronic pain   (followed by Dr Nicholaus Bloom.)  8.  Tobacco abuse (patient was again counseled on smoking cessation)  9.  Osteopenia,    INTERVAL HISTORY:  Tamara Webb presents to the [facility] today for routine follow-up for her history of breast cancer.  Overall, she reports doing well.  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  She says it is over her whole  body.  She has some pain in her left chest area.  She says she has pain deep inside her chest and it radiates backward.   She does have a h/o tobacco use.  She does have some shortness of breath with strenuous activity.  She also found a new lump underneath her left lateral chest wall.      REVIEW OF SYSTEMS:  Review of Systems  Constitutional: Negative for appetite change, chills, diaphoresis, fatigue, fever and unexpected weight change.  HENT:   Negative for hearing loss and lump/mass.   Eyes: Negative for eye problems and icterus.  Respiratory: Negative for chest tightness, cough and shortness of breath.   Cardiovascular: Negative for chest pain, leg swelling and palpitations.  Gastrointestinal: Negative for abdominal distention and abdominal pain.  Endocrine: Negative for hot flashes.  Genitourinary: Negative for difficulty urinating and dyspareunia.   Musculoskeletal: Negative for arthralgias.  Skin: Negative for itching and rash.  Neurological: Negative for dizziness, headaches and light-headedness.  Hematological: Negative for adenopathy. Does not bruise/bleed easily.  Psychiatric/Behavioral: Negative for depression. The patient is not nervous/anxious.    Breast: Denies any new nodularity, masses, tenderness, nipple changes, or nipple discharge.     PAST MEDICAL/SURGICAL HISTORY:  Past Medical History:  Diagnosis Date  . Allergy   . Breast cancer    bilateral   . Cancer 1999&2006   breast cancer x2   . COPD (chronic obstructive pulmonary disease)   . Heart murmur 1999  . Neuropathy   . Neuropathy due to chemotherapeutic drug    breast cancer , Dr Truddie Coco.    Past Surgical  History:  Procedure Laterality Date  . APPENDECTOMY    . BREAST RECONSTRUCTION    . CARPAL TUNNEL RELEASE     right  . ECTOPIC PREGNANCY SURGERY    . LYMPH GLAND EXCISION     left arm  . MASTECTOMY  2006   bil.mastectomy with abdominal trans flap.  . OVARY SURGERY     after ectopic sx  .  TONSILLECTOMY  as child  . TOTAL ABDOMINAL HYSTERECTOMY  2001     ALLERGIES:  Allergies  Allergen Reactions  . Gadolinium Derivatives Shortness Of Breath and Swelling    Given for xray  . Penicillins Shortness Of Breath  . Ivp Dye [Iodinated Diagnostic Agents]   . Pepto-Bismol [Bismuth Subsalicylate] Swelling     CURRENT MEDICATIONS:  Outpatient Encounter Prescriptions as of 02/12/2017  Medication Sig  . Cholecalciferol (VITAMIN D PO) Take 5,000 Units by mouth daily.  . cyclobenzaprine (FLEXERIL) 10 MG tablet Take 1-2 tablets (10-20 mg total) by mouth daily as needed (1-2 tabs as needed).  . gabapentin (NEURONTIN) 600 MG tablet Take 1 tablet (600 mg total) by mouth 3 (three) times daily.  . Multiple Vitamin (MULTIVITAMIN) tablet Take 1 tablet by mouth daily.  . Oxycodone HCl 20 MG TABS take 1 tablet by mouth every 4 hours if needed  . [DISCONTINUED] Oxycodone HCl 10 MG TABS Take 2 tablets (20 mg total) by mouth as needed. One po q 4 to 6 hours po .  Patient will be followed by pain clinic   No facility-administered encounter medications on file as of 02/12/2017.      ONCOLOGIC FAMILY HISTORY:  Family History  Problem Relation Age of Onset  . Colon polyps Sister 45  . Diabetes Sister   . Colon cancer Neg Hx   . Diabetes Mother   . Multiple sclerosis Mother   . Heart attack Mother   . Stroke Mother   . Diabetes Father   . Heart attack Father   . Cancer Father        Kidney cancer    GENETIC COUNSELING/TESTING: BRCA previously done  SOCIAL HISTORY:  Tamara Webb is married and lives with her husband in [place], [place].  She has no children. She has a sister who lives nearby.   Tamara Webb is currently not employed due to her pain.  She denies any current or history of tobacco, alcohol, or illicit drug use.     PHYSICAL EXAMINATION:  Vital Signs: Vitals:   02/12/17 1422  BP: (!) 151/79  Pulse: 86  Resp: 18  Temp: 98.3 F (36.8 C)   Filed Weights    02/12/17 1422  Weight: 127 lb 1.6 oz (57.7 kg)   General: Well-nourished, well-appearing female in no acute distress.  Unaccompanied today.   HEENT: Head is normocephalic.  Pupils equal and reactive to light. Conjunctivae clear without exudate.  Sclerae anicteric. Oral mucosa is pink, moist.  Oropharynx is pink without lesions or erythema.  Lymph: No cervical, supraclavicular, or infraclavicular lymphadenopathy noted on palpation.  Cardiovascular: Regular rate and rhythm.Marland Kitchen Respiratory: Clear to auscultation bilaterally. Chest expansion symmetric; breathing non-labored.  Breast Exam:  Right breast s/p mastectomy and implant placement. No nodules, masses, or skin changes noted.  Left breast s/p tram flap reconstruction, no nodules or masses, however under left axilla more towards the chest wall there is a 1cm well circumscribed firm but mobile nodule ? Lymph node  -Axilla: No axillary adenopathy bilaterally.  GI: Abdomen soft and round; non-tender, non-distended.  Bowel sounds normoactive. No hepatosplenomegaly.   GU: Deferred.  Neuro: No focal deficits. Steady gait.  Psych: Mood and affect normal and appropriate for situation.  Extremities: No edema. Skin: Warm and dry.  LABORATORY DATA:  None for this visit   DIAGNOSTIC IMAGING:  Most recent mammogram: s/p bilateral mastectomy    ASSESSMENT AND PLAN:  Ms.. Webb is a pleasant 61 y.o. female with history of recurrent breast cancer last in 2006.  She presents to the [facility] for surveillance and routine follow-up.   1. History of breast cancer:  Tamara Webb is currently radiographically without evidence of disease or recurrence of breast cancer. Please see #2 and 3.   I encouraged her to call me with any questions or concerns before her next visit at the cancer center, and I would be happy to see her sooner, if needed.    2. Left axillary nodule/lymph node: I ordered an ultrasound to be done at the breast center to evaluate.      3. Left sided chest pain: We will get a chest xray on this to evaluate.  May ? Need CT scan.    4. Cancer screening:  Due to Tamara Webb's history and her age, she should receive screening for skin cancers, colon cancer, and gynecologic cancers. She was encouraged to follow-up with her PCP for appropriate cancer screenings.   5. Health maintenance and wellness promotion: Tamara Webb was encouraged to consume 5-7 servings of fruits and vegetables per day. She was also encouraged to engage in moderate to vigorous exercise for 30 minutes per day most days of the week. She was instructed to limit her alcohol consumption and continue to abstain from tobacco use.    Dispo:  -Return to cancer center in one year for LTS visit -chest xray today -ultrasound when available   A total of (30) minutes of face-to-face time was spent with this patient with greater than 50% of that time in counseling and care-coordination.   Tamara Webb, [place] [phone]   Note: PRIMARY CARE PROVIDER Janifer Adie, [place] [phone]

## 2017-02-12 NOTE — Telephone Encounter (Signed)
Per Mendel Ryder, notified patient of results of chest x-ray- lungs and bones look good, and no changes noted. Patient verbalized understanding, and expressed appreciation for the call.

## 2017-02-12 NOTE — Telephone Encounter (Deleted)
-----   Message from Gardenia Phlegm, NP sent at 02/12/2017  4:16 PM EDT ----- Lungs and bone look good, no changes noted.  Please let patient know

## 2017-02-20 ENCOUNTER — Ambulatory Visit
Admission: RE | Admit: 2017-02-20 | Discharge: 2017-02-20 | Disposition: A | Discharge status: Home / Self Care | Payer: Medicare Other | Source: Ambulatory Visit | Attending: Adult Health | Admitting: Adult Health

## 2017-02-20 DIAGNOSIS — N6323 Unspecified lump in the left breast, lower outer quadrant: Secondary | ICD-10-CM | POA: Diagnosis not present

## 2017-02-20 DIAGNOSIS — C50912 Malignant neoplasm of unspecified site of left female breast: Secondary | ICD-10-CM

## 2017-02-25 DIAGNOSIS — Z79891 Long term (current) use of opiate analgesic: Secondary | ICD-10-CM | POA: Diagnosis not present

## 2017-02-25 DIAGNOSIS — M791 Myalgia: Secondary | ICD-10-CM | POA: Diagnosis not present

## 2017-02-25 DIAGNOSIS — G894 Chronic pain syndrome: Secondary | ICD-10-CM | POA: Diagnosis not present

## 2017-02-25 DIAGNOSIS — G620 Drug-induced polyneuropathy: Secondary | ICD-10-CM | POA: Diagnosis not present

## 2017-02-27 ENCOUNTER — Telehealth: Payer: Self-pay

## 2017-02-27 ENCOUNTER — Telehealth: Payer: Self-pay | Admitting: *Deleted

## 2017-02-27 NOTE — Telephone Encounter (Signed)
Would recommend that patient re-discuss with the radiologist who recommended this intervention.

## 2017-02-27 NOTE — Telephone Encounter (Signed)
Pt saw Mendel Ryder on 6/7 and had an ultrasound on 6/15 which showed a possible lymph node on side of L breast. The radiologist that did it said there was a possibility of a needle aspiration. Pt and husband discussed this and would like to go ahead if this is indeed the case.

## 2017-02-27 NOTE — Telephone Encounter (Signed)
Called pt back and explained to her the results did not show any malignancy to the left breast. Pt will call [facility] to ask if she should get the needle biopsy since they told her that was an option as recommended. In the meantime I have mailed her a copy of report today as requested. Pt thanked [place] for the call back. Message to be fwd to [employer].

## 2017-02-27 NOTE — Telephone Encounter (Signed)
Received call from pt who was concerned with her breast ultrasound which seems to be normal. I told pt I will consult with NP and call her back.

## 2017-03-17 ENCOUNTER — Encounter: Payer: Self-pay | Admitting: Genetic Counselor

## 2017-03-18 ENCOUNTER — Other Ambulatory Visit: Payer: Medicare Other

## 2017-03-18 ENCOUNTER — Encounter: Payer: Self-pay | Admitting: Genetic Counselor

## 2017-03-18 ENCOUNTER — Ambulatory Visit (HOSPITAL_BASED_OUTPATIENT_CLINIC_OR_DEPARTMENT_OTHER): Payer: Medicare Other | Admitting: Genetic Counselor

## 2017-03-18 DIAGNOSIS — C50912 Malignant neoplasm of unspecified site of left female breast: Secondary | ICD-10-CM | POA: Diagnosis not present

## 2017-03-18 DIAGNOSIS — Z8051 Family history of malignant neoplasm of kidney: Secondary | ICD-10-CM | POA: Diagnosis not present

## 2017-03-18 DIAGNOSIS — Z315 Encounter for genetic counseling: Secondary | ICD-10-CM | POA: Diagnosis not present

## 2017-03-18 DIAGNOSIS — Z8371 Family history of colonic polyps: Secondary | ICD-10-CM | POA: Diagnosis not present

## 2017-03-18 DIAGNOSIS — Z803 Family history of malignant neoplasm of breast: Secondary | ICD-10-CM

## 2017-03-18 DIAGNOSIS — Z853 Personal history of malignant neoplasm of breast: Secondary | ICD-10-CM

## 2017-03-18 DIAGNOSIS — Z809 Family history of malignant neoplasm, unspecified: Secondary | ICD-10-CM

## 2017-03-18 NOTE — Progress Notes (Addendum)
REFERRING PROVIDER: Chauncey Cruel, MD [address] [place], [place] [address]  PRIMARY PROVIDER:  Janifer Adie, MD  PRIMARY REASON FOR VISIT:  1. Malignant neoplasm of left female breast, unspecified estrogen receptor status, unspecified site of breast ([place])   2. Family history of breast cancer      HISTORY OF PRESENT ILLNESS:   Ms. Tamara Webb, a 61 y.o. female, was seen for a [place] cancer genetics consultation at the request of Dr. Jana Hakim due to a personal and family history of cancer.  Ms. Zachery presents to clinic today, with her husband [name], to discuss the possibility of a hereditary predisposition to cancer, genetic testing, and to further clarify her future cancer risks, as well as potential cancer risks for family members.   In 1999, at the age of 4, Ms. Diprima was diagnosed with ER+ cancer of the left breast. This was treated with lumpectomy, chemotherapy and tamoxifen.  In 2006, at the age of 14, Ms. Chahal was diagnosed with triple negative cancer of the left breast and calcifications of her right breast.  This was treated with a double mastectomy.  She had BRCA testing through [facility] at that time and was negative.  She had a full hysterectomy in 2002 due to tumors growing in her uterus, thought maybe due to the tamoxifen.      CANCER HISTORY:   No history exists.     HORMONAL RISK FACTORS:  Menarche was at age 61.  First live birth at age N/A.  OCP use for approximately 5-6 years.  Ovaries intact: no.  Hysterectomy: yes.  Menopausal status: postmenopausal.  HRT use: 0 years. Colonoscopy: yes; had some polyps. Mammogram within the last year: no.  Patient has had u/s, and xray's. Number of breast biopsies: 2. Up to date with pelvic exams:  no. Any excessive radiation exposure in the past:  cancer treatment  Past Medical History:  Diagnosis Date  . Allergy   . Breast cancer ([place])    bilateral   . Cancer ([place]) 1999&2006   breast cancer x2    . COPD (chronic obstructive pulmonary disease) ([place])   . Family history of breast cancer   . Heart murmur 1999  . Neuropathy   . Neuropathy due to chemotherapeutic drug [facility])    breast cancer , Dr Truddie Coco.     Past Surgical History:  Procedure Laterality Date  . APPENDECTOMY    . BREAST RECONSTRUCTION    . CARPAL TUNNEL RELEASE     right  . ECTOPIC PREGNANCY SURGERY    . LYMPH GLAND EXCISION     left arm  . MASTECTOMY  2006   bil.mastectomy with abdominal trans flap.  . OVARY SURGERY     after ectopic sx  . TONSILLECTOMY  as child  . TOTAL ABDOMINAL HYSTERECTOMY  2001    Social History   Social History  . Marital status: Married    Spouse name: [name]   . Number of children: 0  . Years of education: 12+   Occupational History  .  Unemployed   Social History Main Topics  . Smoking status: Current Every Day Smoker    Packs/day: 1.00    Types: Cigarettes  . Smokeless tobacco: Never Used     Comment: quit for 20 years,just started back in 2000  . Alcohol use No  . Drug use: No  . Sexual activity: Yes    Birth control/ protection: Surgical   Other Topics Concern  . None  Social History Narrative   Patient lives with [name] her husband.    Patient has no children.    Patient is currently not working.    Patient has some college.      FAMILY HISTORY:  We obtained a detailed, 4-generation family history.  Significant diagnoses are listed below: Family History  Problem Relation Age of Onset  . Colon polyps Sister 18  . Diabetes Sister   . Diabetes Mother   . Multiple sclerosis Mother   . Heart attack Mother   . Stroke Mother   . Diabetes Father   . Heart attack Father   . Cancer Father        Kidney cancer  . Breast cancer Paternal Aunt        dx in her 19s  . Dementia Maternal Grandmother   . Heart attack Maternal Grandfather   . Pulmonary embolism Paternal Grandmother   . Cancer Paternal Grandfather        unknown type  . Breast cancer Cousin         paternal first cousin dx under 59  . Colon cancer Neg Hx    The patient does not have children.  She had two brothers and two sisters who are cancer free.  Her parents are both deceased.  Her mother died of diabetes complications at 16 and her father died of kidney cancer at 87.  Her mother was one of 12 children.  None of her siblings had cancer.  Her maternal grandparents died at older ages, the grandmother died of dementia and grandfather died of a heart attack.  Her father had four brothers and one sister.  His sister had breast cancer and died in her 72's.  One of the brothers had a daughter who had breast cancer under 66.  Her paternal grandfather had an unknown cancer and her grandmother had a PE.  Patient's maternal ancestors are of [place] descent, and paternal ancestors are of [place] descent. There is possible Ashkenazi Jewish ancestry. There is no known consanguinity.  GENETIC COUNSELING ASSESSMENT: ADENA SIMA is a 61 y.o. female with a personal and family history of breast cancer which is somewhat suggestive of a hereditary cancer syndrome and predisposition to cancer. We, therefore, discussed and recommended the following at today's visit.   DISCUSSION: We discussed that about 5-10% of breast cancer is hereditary wit most cases due to BRCA mutations.  The patient was tested for BRCA mutations in the past, but since that time we have been able to identify mutations that we were not able to identify in 2006.  Therefore, we should repeat her BRCA testing.   Additionally, there are other genes that contribute to hereditary breast cancer.  These include ATM, CHEK2 and PALB2, among others.  We reviewed the characteristics, features and inheritance patterns of hereditary cancer syndromes. We also discussed genetic testing, including the appropriate family members to test, the process of testing, insurance coverage and turn-around-time for results. We discussed the implications of a  negative, positive and/or variant of uncertain significant result. We recommended Ms. Heideman pursue genetic testing for the Common Hereditary cancer gene panel. The Hereditary Gene Panel offered by [facility] includes sequencing and/or deletion duplication testing of the following 46 genes: APC, ATM, AXIN2, BARD1, BMPR1A, BRCA1, BRCA2, BRIP1, CDH1, CDKN2A (p14ARF), CDKN2A (p16INK4a), CHEK2, CTNNA1, DICER1, EPCAM (Deletion/duplication testing only), GREM1 (promoter region deletion/duplication testing only), KIT, MEN1, MLH1, MSH2, MSH3, MSH6, MUTYH, NBN, NF1, NHTL1, PALB2, PDGFRA, PMS2, POLD1, POLE, PTEN, RAD50, RAD51C,  RAD51D, SDHB, SDHC, SDHD, SMAD4, SMARCA4. STK11, TP53, TSC1, TSC2, and VHL.  The following genes were evaluated for sequence changes only: SDHA and HOXB13 c.251G>A variant only.  Based on Ms. Marconi's personal and family history of cancer, she meets medical criteria for genetic testing. Despite that she meets criteria, she may still have an out of pocket cost. We discussed that if her out of pocket cost for testing is over $100, the laboratory will call and confirm whether she wants to proceed with testing.  If the out of pocket cost of testing is less than $100 she will be billed by the genetic testing laboratory.   PLAN: After considering the risks, benefits, and limitations, Ms. Rozycki  provided informed consent to pursue genetic testing and the blood sample was sent to [facility] for analysis of the Common Hereditary cancer panel. Results should be available within approximately 2-3 weeks' time, at which point they will be disclosed by telephone to Ms. Vogt, as will any additional recommendations warranted by these results. Ms. Ingman will receive a summary of her genetic counseling visit and a copy of her results once available. This information will also be available in Epic. We encouraged Ms. Hutt to remain in contact with cancer genetics annually so that we can continuously  update the family history and inform her of any changes in cancer genetics and testing that may be of benefit for her family. Ms. Satterwhite questions were answered to her satisfaction today. Our contact information was provided should additional questions or concerns arise.  Lastly, we encouraged Ms. Doucette to remain in contact with cancer genetics annually so that we can continuously update the family history and inform her of any changes in cancer genetics and testing that may be of benefit for this family.   Ms.  Carradine questions were answered to her satisfaction today. Our contact information was provided should additional questions or concerns arise. Thank you for the referral and allowing [place] to share in the care of your patient.   Aaliayah Miao P. Florene Glen, [place], [facility] Certified Genetic Counselor [email or web address] phone: [phone]  The patient was seen for a total of 50 minutes in face-to-face genetic counseling.  This patient was discussed with Drs. Magrinat, Lindi Adie and/or Burr Medico who agrees with the above.    _______________________________________________________________________ For Office Staff:  Number of people involved in session: 2 Was an Intern/ student involved with case: no

## 2017-03-25 DIAGNOSIS — Z79891 Long term (current) use of opiate analgesic: Secondary | ICD-10-CM | POA: Diagnosis not present

## 2017-03-25 DIAGNOSIS — G894 Chronic pain syndrome: Secondary | ICD-10-CM | POA: Diagnosis not present

## 2017-03-25 DIAGNOSIS — N644 Mastodynia: Secondary | ICD-10-CM | POA: Diagnosis not present

## 2017-03-25 DIAGNOSIS — G620 Drug-induced polyneuropathy: Secondary | ICD-10-CM | POA: Diagnosis not present

## 2017-03-30 ENCOUNTER — Ambulatory Visit: Payer: Self-pay | Admitting: Genetic Counselor

## 2017-03-30 ENCOUNTER — Encounter: Payer: Self-pay | Admitting: Genetic Counselor

## 2017-03-30 ENCOUNTER — Telehealth: Payer: Self-pay | Admitting: Genetic Counselor

## 2017-03-30 DIAGNOSIS — Z1379 Encounter for other screening for genetic and chromosomal anomalies: Secondary | ICD-10-CM

## 2017-03-30 DIAGNOSIS — C50912 Malignant neoplasm of unspecified site of left female breast: Secondary | ICD-10-CM

## 2017-03-30 DIAGNOSIS — Z803 Family history of malignant neoplasm of breast: Secondary | ICD-10-CM

## 2017-03-30 NOTE — Telephone Encounter (Signed)
Revealed negative genetic testing.  Discussed that we do not know why she has breast cancer or why there is cancer in the family. It could be due to a different gene that we are not testing, or maybe our current technology may not be able to pick something up.  It will be important for her to keep in contact with genetics to keep up with whether additional testing may be needed.  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  Discussed that we do not change medical management based on this result.  We will contact her in the future when it is reclassified.

## 2017-03-30 NOTE — Progress Notes (Signed)
HPI: Tamara Webb was previously seen in the [facility] clinic due to a personal and family history of cancer and concerns regarding a hereditary predisposition to cancer. Please refer to our prior cancer genetics clinic note for more information regarding Tamara Webb's medical, social and family histories, and our assessment and recommendations, at the time. Tamara Webb recent genetic test results were disclosed to her, as were recommendations warranted by these results. These results and recommendations are discussed in more detail below.  CANCER HISTORY:    Breast cancer, left breast ([place])   04/11/2014 Initial Diagnosis    Breast cancer, left breast ([place])     03/27/2017 Genetic Testing    CHEK2 c.1091C>T (p.Ile364Thr) VUS identified on the common hereditary cancer panel.  The Hereditary Gene Panel offered by [facility] includes sequencing and/or deletion duplication testing of the following 46 genes: APC, ATM, AXIN2, BARD1, BMPR1A, BRCA1, BRCA2, BRIP1, CDH1, CDKN2A (p14ARF), CDKN2A (p16INK4a), CHEK2, CTNNA1, DICER1, EPCAM (Deletion/duplication testing only), GREM1 (promoter region deletion/duplication testing only), KIT, MEN1, MLH1, MSH2, MSH3, MSH6, MUTYH, NBN, NF1, NHTL1, PALB2, PDGFRA, PMS2, POLD1, POLE, PTEN, RAD50, RAD51C, RAD51D, SDHB, SDHC, SDHD, SMAD4, SMARCA4. STK11, TP53, TSC1, TSC2, and VHL.  The following genes were evaluated for sequence changes only: SDHA and HOXB13 c.251G>A variant only.  The report date is March 27, 2017.        FAMILY HISTORY:  We obtained a detailed, 4-generation family history.  Significant diagnoses are listed below: Family History  Problem Relation Age of Onset  . Colon polyps Sister 24  . Diabetes Sister   . Diabetes Mother   . Multiple sclerosis Mother   . Heart attack Mother   . Stroke Mother   . Diabetes Father   . Heart attack Father   . Cancer Father        Kidney cancer  . Breast cancer Paternal Aunt        dx in her 65s  .  Dementia Maternal Grandmother   . Heart attack Maternal Grandfather   . Pulmonary embolism Paternal Grandmother   . Cancer Paternal Grandfather        unknown type  . Breast cancer Cousin        paternal first cousin dx under 6  . Colon cancer Neg Hx     The patient does not have children.  She had two brothers and two sisters who are cancer free.  Her parents are both deceased.  Her mother died of diabetes complications at 22 and her father died of kidney cancer at 56.  Her mother was one of 12 children.  None of her siblings had cancer.  Her maternal grandparents died at older ages, the grandmother died of dementia and grandfather died of a heart attack.  Her father had four brothers and one sister.  His sister had breast cancer and died in her 31's.  One of the brothers had a daughter who had breast cancer under 58.  Her paternal grandfather had an unknown cancer and her grandmother had a PE.  Patient's maternal ancestors are of [place] descent, and paternal ancestors are of [place] descent. There is possible Ashkenazi Jewish ancestry. There is no known consanguinity.  GENETIC TEST RESULTS: Genetic testing reported out on March 30, 2017 through the Common Hereditary cancer panel found no deleterious mutations.  The Hereditary Gene Panel offered by [facility] includes sequencing and/or deletion duplication testing of the following 46 genes: APC, ATM, AXIN2, BARD1, BMPR1A, BRCA1, BRCA2, BRIP1, CDH1, CDKN2A (p14ARF),  CDKN2A (p16INK4a), CHEK2, CTNNA1, DICER1, EPCAM (Deletion/duplication testing only), GREM1 (promoter region deletion/duplication testing only), KIT, MEN1, MLH1, MSH2, MSH3, MSH6, MUTYH, NBN, NF1, NHTL1, PALB2, PDGFRA, PMS2, POLD1, POLE, PTEN, RAD50, RAD51C, RAD51D, SDHB, SDHC, SDHD, SMAD4, SMARCA4. STK11, TP53, TSC1, TSC2, and VHL.  The following genes were evaluated for sequence changes only: SDHA and HOXB13 c.251G>A variant only.  The test report has been scanned into EPIC and is  located under the Molecular Pathology section of the Results Review tab.   We discussed with [name] that since the current genetic testing is not perfect, it is possible there may be a gene mutation in one of these genes that current testing cannot detect, but that chance is small. We also discussed, that it is possible that another gene that has not yet been discovered, or that we have not yet tested, is responsible for the cancer diagnoses in the family, and it is, therefore, important to remain in touch with cancer genetics in the future so that we can continue to offer [name] the most up to date genetic testing.   Genetic testing did detect a Variant of Unknown Significance in the CHEK2 gene called c.1091T>C (p.[id number]). At this time, it is unknown if this variant is associated with increased cancer risk or if this is a normal finding, but most variants such as this get reclassified to being inconsequential. It should not be used to make medical management decisions. With time, we suspect the lab will determine the significance of this variant, if any. If we do learn more about it, we will try to contact Tamara Webb to discuss it further. However, it is important to stay in touch with [place] periodically and keep the address and phone number up to date.    CANCER SCREENING RECOMMENDATIONS: This result is reassuring and indicates that [name] likely does not have an increased risk for a future cancer due to a mutation in one of these genes. This normal test also suggests that [name]'s cancer was most likely not due to an inherited predisposition associated with one of these genes.  Most cancers happen by chance and this negative test suggests that her cancer falls into this category.  We, therefore, recommended she continue to follow the cancer management and screening guidelines provided by her oncology and primary healthcare provider.   RECOMMENDATIONS FOR FAMILY MEMBERS: Women in this  family might be at some increased risk of developing cancer, over the general population risk, simply due to the family history of cancer. We recommended women in this family have a yearly mammogram beginning at age 37, or 21 years younger than the earliest onset of cancer, an annual clinical breast exam, and perform monthly breast self-exams. Women in this family should also have a gynecological exam as recommended by their primary provider. All family members should have a colonoscopy by age 68.  FOLLOW-UP: Lastly, we discussed with Ms. Sliwinski that cancer genetics is a rapidly advancing field and it is possible that new genetic tests will be appropriate for her and/or her family members in the future. We encouraged her to remain in contact with cancer genetics on an annual basis so we can update her personal and family histories and let her know of advances in cancer genetics that may benefit this family.   Our contact number was provided. Ms. Folden questions were answered to her satisfaction, and she knows she is welcome to call us at anytime with additional questions or concerns.  Roma Kayser, MS, [facility] Certified Genetic Counselor [email or web address]

## 2017-04-22 DIAGNOSIS — N644 Mastodynia: Secondary | ICD-10-CM | POA: Diagnosis not present

## 2017-04-22 DIAGNOSIS — Z79891 Long term (current) use of opiate analgesic: Secondary | ICD-10-CM | POA: Diagnosis not present

## 2017-04-22 DIAGNOSIS — G620 Drug-induced polyneuropathy: Secondary | ICD-10-CM | POA: Diagnosis not present

## 2017-04-22 DIAGNOSIS — G894 Chronic pain syndrome: Secondary | ICD-10-CM | POA: Diagnosis not present

## 2017-05-04 DIAGNOSIS — R599 Enlarged lymph nodes, unspecified: Secondary | ICD-10-CM | POA: Diagnosis not present

## 2017-05-20 DIAGNOSIS — N644 Mastodynia: Secondary | ICD-10-CM | POA: Diagnosis not present

## 2017-05-20 DIAGNOSIS — Z79891 Long term (current) use of opiate analgesic: Secondary | ICD-10-CM | POA: Diagnosis not present

## 2017-05-20 DIAGNOSIS — G894 Chronic pain syndrome: Secondary | ICD-10-CM | POA: Diagnosis not present

## 2017-05-20 DIAGNOSIS — G620 Drug-induced polyneuropathy: Secondary | ICD-10-CM | POA: Diagnosis not present

## 2017-06-03 ENCOUNTER — Other Ambulatory Visit: Payer: Self-pay | Admitting: General Surgery

## 2017-06-03 DIAGNOSIS — I899 Noninfective disorder of lymphatic vessels and lymph nodes, unspecified: Secondary | ICD-10-CM | POA: Diagnosis not present

## 2017-06-03 DIAGNOSIS — R590 Localized enlarged lymph nodes: Secondary | ICD-10-CM | POA: Diagnosis not present

## 2017-06-17 DIAGNOSIS — Z79891 Long term (current) use of opiate analgesic: Secondary | ICD-10-CM | POA: Diagnosis not present

## 2017-06-17 DIAGNOSIS — N644 Mastodynia: Secondary | ICD-10-CM | POA: Diagnosis not present

## 2017-06-17 DIAGNOSIS — G894 Chronic pain syndrome: Secondary | ICD-10-CM | POA: Diagnosis not present

## 2017-06-17 DIAGNOSIS — G620 Drug-induced polyneuropathy: Secondary | ICD-10-CM | POA: Diagnosis not present

## 2017-07-15 DIAGNOSIS — G620 Drug-induced polyneuropathy: Secondary | ICD-10-CM | POA: Diagnosis not present

## 2017-07-15 DIAGNOSIS — Z79891 Long term (current) use of opiate analgesic: Secondary | ICD-10-CM | POA: Diagnosis not present

## 2017-07-15 DIAGNOSIS — G894 Chronic pain syndrome: Secondary | ICD-10-CM | POA: Diagnosis not present

## 2017-07-15 DIAGNOSIS — N644 Mastodynia: Secondary | ICD-10-CM | POA: Diagnosis not present

## 2017-08-12 DIAGNOSIS — G894 Chronic pain syndrome: Secondary | ICD-10-CM | POA: Diagnosis not present

## 2017-08-12 DIAGNOSIS — G620 Drug-induced polyneuropathy: Secondary | ICD-10-CM | POA: Diagnosis not present

## 2017-08-12 DIAGNOSIS — Z79891 Long term (current) use of opiate analgesic: Secondary | ICD-10-CM | POA: Diagnosis not present

## 2017-08-12 DIAGNOSIS — N644 Mastodynia: Secondary | ICD-10-CM | POA: Diagnosis not present

## 2017-09-09 DIAGNOSIS — Z853 Personal history of malignant neoplasm of breast: Secondary | ICD-10-CM | POA: Diagnosis not present

## 2017-09-09 DIAGNOSIS — Z9882 Breast implant status: Secondary | ICD-10-CM | POA: Diagnosis not present

## 2017-09-16 ENCOUNTER — Telehealth: Payer: Self-pay

## 2017-09-16 DIAGNOSIS — N644 Mastodynia: Secondary | ICD-10-CM | POA: Diagnosis not present

## 2017-09-16 DIAGNOSIS — G620 Drug-induced polyneuropathy: Secondary | ICD-10-CM | POA: Diagnosis not present

## 2017-09-16 DIAGNOSIS — G894 Chronic pain syndrome: Secondary | ICD-10-CM | POA: Diagnosis not present

## 2017-09-16 DIAGNOSIS — Z79891 Long term (current) use of opiate analgesic: Secondary | ICD-10-CM | POA: Diagnosis not present

## 2017-09-16 NOTE — Telephone Encounter (Signed)
Pt request for medical records release received via fax from [facility] requesting CXR from 2018.   Report has been faxed and a paper copy of the signed release form has been sent to HIM.

## 2017-09-17 ENCOUNTER — Telehealth: Payer: Self-pay | Admitting: *Deleted

## 2017-09-17 NOTE — Telephone Encounter (Signed)
-----   Message from Paulla Dolly, RN sent at 09/16/2017  4:28 PM EST ----- Regarding: medical records request Medical records request received via fax from [facility] for 2018 CXR. I have faxed results.  Will put paper copy of request in HIM bin if needed for further records release. Thanks,  Elmyra Ricks

## 2017-10-14 DIAGNOSIS — Z79891 Long term (current) use of opiate analgesic: Secondary | ICD-10-CM | POA: Diagnosis not present

## 2017-10-14 DIAGNOSIS — N644 Mastodynia: Secondary | ICD-10-CM | POA: Diagnosis not present

## 2017-10-14 DIAGNOSIS — G894 Chronic pain syndrome: Secondary | ICD-10-CM | POA: Diagnosis not present

## 2017-10-14 DIAGNOSIS — G620 Drug-induced polyneuropathy: Secondary | ICD-10-CM | POA: Diagnosis not present

## 2017-11-09 DIAGNOSIS — Z9882 Breast implant status: Secondary | ICD-10-CM | POA: Diagnosis not present

## 2017-11-09 DIAGNOSIS — Z853 Personal history of malignant neoplasm of breast: Secondary | ICD-10-CM | POA: Diagnosis not present

## 2017-11-09 DIAGNOSIS — M940 Chondrocostal junction syndrome [Tietze]: Secondary | ICD-10-CM | POA: Diagnosis not present

## 2017-11-11 DIAGNOSIS — G894 Chronic pain syndrome: Secondary | ICD-10-CM | POA: Diagnosis not present

## 2017-11-11 DIAGNOSIS — G620 Drug-induced polyneuropathy: Secondary | ICD-10-CM | POA: Diagnosis not present

## 2017-11-11 DIAGNOSIS — Z79891 Long term (current) use of opiate analgesic: Secondary | ICD-10-CM | POA: Diagnosis not present

## 2017-11-11 DIAGNOSIS — N644 Mastodynia: Secondary | ICD-10-CM | POA: Diagnosis not present

## 2017-12-09 DIAGNOSIS — Z79891 Long term (current) use of opiate analgesic: Secondary | ICD-10-CM | POA: Diagnosis not present

## 2017-12-09 DIAGNOSIS — G620 Drug-induced polyneuropathy: Secondary | ICD-10-CM | POA: Diagnosis not present

## 2017-12-09 DIAGNOSIS — G894 Chronic pain syndrome: Secondary | ICD-10-CM | POA: Diagnosis not present

## 2017-12-09 DIAGNOSIS — N644 Mastodynia: Secondary | ICD-10-CM | POA: Diagnosis not present

## 2018-01-06 DIAGNOSIS — N644 Mastodynia: Secondary | ICD-10-CM | POA: Diagnosis not present

## 2018-01-06 DIAGNOSIS — Z79891 Long term (current) use of opiate analgesic: Secondary | ICD-10-CM | POA: Diagnosis not present

## 2018-01-06 DIAGNOSIS — G620 Drug-induced polyneuropathy: Secondary | ICD-10-CM | POA: Diagnosis not present

## 2018-01-06 DIAGNOSIS — G894 Chronic pain syndrome: Secondary | ICD-10-CM | POA: Diagnosis not present

## 2018-02-03 DIAGNOSIS — Z79891 Long term (current) use of opiate analgesic: Secondary | ICD-10-CM | POA: Diagnosis not present

## 2018-02-03 DIAGNOSIS — G620 Drug-induced polyneuropathy: Secondary | ICD-10-CM | POA: Diagnosis not present

## 2018-02-03 DIAGNOSIS — N644 Mastodynia: Secondary | ICD-10-CM | POA: Diagnosis not present

## 2018-02-03 DIAGNOSIS — G894 Chronic pain syndrome: Secondary | ICD-10-CM | POA: Diagnosis not present

## 2018-02-12 ENCOUNTER — Encounter: Payer: Medicare Other | Admitting: Adult Health

## 2018-03-02 ENCOUNTER — Inpatient Hospital Stay: Payer: Medicare Other | Attending: Adult Health | Admitting: Adult Health

## 2018-03-02 ENCOUNTER — Telehealth: Payer: Self-pay | Admitting: Adult Health

## 2018-03-02 ENCOUNTER — Encounter: Payer: Self-pay | Admitting: Adult Health

## 2018-03-02 ENCOUNTER — Other Ambulatory Visit: Payer: Self-pay | Admitting: Adult Health

## 2018-03-02 ENCOUNTER — Inpatient Hospital Stay: Payer: Medicare Other

## 2018-03-02 VITALS — BP 158/86 | HR 86 | Temp 98.2°F | Resp 18 | Ht 65.0 in | Wt 120.4 lb

## 2018-03-02 DIAGNOSIS — Z8051 Family history of malignant neoplasm of kidney: Secondary | ICD-10-CM | POA: Diagnosis not present

## 2018-03-02 DIAGNOSIS — G629 Polyneuropathy, unspecified: Secondary | ICD-10-CM | POA: Insufficient documentation

## 2018-03-02 DIAGNOSIS — L989 Disorder of the skin and subcutaneous tissue, unspecified: Secondary | ICD-10-CM | POA: Diagnosis not present

## 2018-03-02 DIAGNOSIS — G8929 Other chronic pain: Secondary | ICD-10-CM | POA: Diagnosis not present

## 2018-03-02 DIAGNOSIS — C50912 Malignant neoplasm of unspecified site of left female breast: Secondary | ICD-10-CM

## 2018-03-02 DIAGNOSIS — Z853 Personal history of malignant neoplasm of breast: Secondary | ICD-10-CM | POA: Diagnosis not present

## 2018-03-02 DIAGNOSIS — Z9221 Personal history of antineoplastic chemotherapy: Secondary | ICD-10-CM | POA: Insufficient documentation

## 2018-03-02 DIAGNOSIS — Z79899 Other long term (current) drug therapy: Secondary | ICD-10-CM | POA: Insufficient documentation

## 2018-03-02 DIAGNOSIS — Z9013 Acquired absence of bilateral breasts and nipples: Secondary | ICD-10-CM | POA: Diagnosis not present

## 2018-03-02 DIAGNOSIS — M8580 Other specified disorders of bone density and structure, unspecified site: Secondary | ICD-10-CM | POA: Insufficient documentation

## 2018-03-02 DIAGNOSIS — R591 Generalized enlarged lymph nodes: Secondary | ICD-10-CM | POA: Insufficient documentation

## 2018-03-02 DIAGNOSIS — Z1389 Encounter for screening for other disorder: Secondary | ICD-10-CM

## 2018-03-02 DIAGNOSIS — F17210 Nicotine dependence, cigarettes, uncomplicated: Secondary | ICD-10-CM | POA: Insufficient documentation

## 2018-03-02 DIAGNOSIS — Z803 Family history of malignant neoplasm of breast: Secondary | ICD-10-CM

## 2018-03-02 DIAGNOSIS — Z90710 Acquired absence of both cervix and uterus: Secondary | ICD-10-CM

## 2018-03-02 DIAGNOSIS — J449 Chronic obstructive pulmonary disease, unspecified: Secondary | ICD-10-CM | POA: Diagnosis not present

## 2018-03-02 DIAGNOSIS — Z923 Personal history of irradiation: Secondary | ICD-10-CM | POA: Insufficient documentation

## 2018-03-02 DIAGNOSIS — Z90722 Acquired absence of ovaries, bilateral: Secondary | ICD-10-CM | POA: Diagnosis not present

## 2018-03-02 LAB — CMP (CANCER CENTER ONLY)
ALBUMIN: 4 g/dL (ref 3.5–5.0)
ALT: 12 U/L (ref 0–44)
ANION GAP: 10 (ref 5–15)
AST: 18 U/L (ref 15–41)
Alkaline Phosphatase: 101 U/L (ref 38–126)
BILIRUBIN TOTAL: 0.4 mg/dL (ref 0.3–1.2)
BUN: 7 mg/dL (ref 6–20)
CHLORIDE: 99 mmol/L (ref 98–111)
CO2: 26 mmol/L (ref 22–32)
Calcium: 10 mg/dL (ref 8.9–10.3)
Creatinine: 0.77 mg/dL (ref 0.44–1.00)
GFR, Est AFR Am: >60 mL/min (ref >60)
GFR, Estimated: >60 mL/min (ref >60)
GLUCOSE: 99 mg/dL (ref 70–99)
POTASSIUM: 4.1 mmol/L (ref 3.5–5.1)
Sodium: 135 mmol/L (ref 135–145)
TOTAL PROTEIN: 7.8 g/dL (ref 6.5–8.1)

## 2018-03-02 LAB — CBC WITH DIFFERENTIAL (CANCER CENTER ONLY)
BASOS ABS: 0.1 10*3/uL (ref 0.0–0.1)
Basophils Relative: 0 %
Eosinophils Absolute: 0.2 10*3/uL (ref 0.0–0.5)
Eosinophils Relative: 1 %
HEMATOCRIT: 41.7 % (ref 34.8–46.6)
Hemoglobin: 14.5 g/dL (ref 11.6–15.9)
LYMPHS ABS: 2.6 10*3/uL (ref 0.9–3.3)
LYMPHS PCT: 21 %
MCH: 29.9 pg (ref 25.1–34.0)
MCHC: 34.8 g/dL (ref 31.5–36.0)
MCV: 86 fL (ref 79.5–101.0)
MONO ABS: 0.5 10*3/uL (ref 0.1–0.9)
Monocytes Relative: 4 %
NEUTROS ABS: 9.5 10*3/uL — AB (ref 1.5–6.5)
Neutrophils Relative %: 74 %
Platelet Count: 265 10*3/uL (ref 145–400)
RBC: 4.85 MIL/uL (ref 3.70–5.45)
RDW: 12.9 % (ref 11.2–14.5)
WBC: 12.8 10*3/uL — AB (ref 3.9–10.3)

## 2018-03-02 NOTE — Progress Notes (Signed)
CLINIC:  Survivorship   REASON FOR VISIT:  Routine follow-up for history of breast cancer.   BRIEF ONCOLOGIC HISTORY:    Breast cancer, left breast (Tamara Webb)   04/11/2014 Initial Diagnosis    Breast cancer, left breast (Tamara Webb)      03/27/2017 Genetic Testing    CHEK2 c.1091C>T (p.[id number]) VUS identified on the common hereditary cancer panel.  The Hereditary Gene Panel offered by [facility] includes sequencing and/or deletion duplication testing of the following 46 genes: APC, ATM, AXIN2, BARD1, BMPR1A, BRCA1, BRCA2, BRIP1, CDH1, CDKN2A (p14ARF), CDKN2A (p16INK4a), CHEK2, CTNNA1, DICER1, EPCAM (Deletion/duplication testing only), GREM1 (promoter region deletion/duplication testing only), KIT, MEN1, MLH1, MSH2, MSH3, MSH6, MUTYH, NBN, NF1, NHTL1, PALB2, PDGFRA, PMS2, POLD1, POLE, PTEN, RAD50, RAD51C, RAD51D, SDHB, SDHC, SDHD, SMAD4, SMARCA4. STK11, TP53, TSC1, TSC2, and VHL.  The following genes were evaluated for sequence changes only: SDHA and HOXB13 c.251G>A variant only.  The report date is March 27, 2017.       Per Dr Jana Hakim last note from 04/16/2015:  Tamara Webb is a 62 y.o.[place], [place] woman diagnosed with left-sided breast cancer in 1998  1. She was treated with 4 cycles of neoadjuvant chemotherapy consisting of AC (Adriamycin/Cytoxan) completed in 10/1997.  2. She is status post left breast lumpectomy in 06/1998 for aT2 N0, stage IIA invasive carcinoma, ER positive, PR negative.  3. She is status post left breast radiation therapy, completed in 10/1998.   4. Radiation therapy was followed by 5 years of adjuvant hormonal therapy with various antiestrogen agents.   5.She underwent total bilateral hysterectomy/bilateral salpingo-oophorectomy in 08/2001.   6. Recurrent disease in the left breast diagnosed by corebiopsy 11/12/2004 which showed invasive mammary carcinoma.  7. Status post left breast simple mastectomy and right breast simple mastectomy with left  axillary sentinel node biopsy on 01/06/2005 for a left breast, pT1c, pN0(i-)(sn), stage I invasive ductal carcinoma with lobular features, grade 2, estrogen receptor negative, progesterone receptor negative, Ki-67 6%, HER-2/neu by FISH no amplification(right breast showed no evidence of malignancy)  8. The patient began adjuvant chemotherapy with TC (Taxotere/Cytoxan) on 02/20/2005, completing4 cycles   9. Status post bilateral breast reconstructive surgery with TRAM flaps in 06/2005.  7. Chronic pain (followed by Dr Nicholaus Bloom.)  8. Tobacco abuse (patient was again counseled on smoking cessation)  9. Osteopenia,    INTERVAL HISTORY:  Tamara Webb presents to the [facility] today for routine follow-up for her history of breast cancer.  Overall, she reports feeling quite well. She saw her plastic surgeon, Dr. Harlow Mares after her last appointment with me and was diagnosed with costochondritis of the left chest wall.    She has not seen PCP this year.  She is unsure where to go, or who to see as a PCP.  She says it is also difficult because she has medicare.  She does not exercise, but is active with yard work and around the house.  Current smoker 1ppd for 18 years.  Had quit and smoked 8 years previously.  Total pack years is 26.    She says she has some swollen lymph nodes that popped up earlier this week.     REVIEW OF SYSTEMS:  Review of Systems  Constitutional: Negative for appetite change, chills, fatigue, fever and unexpected weight change.  HENT:   Negative for hearing loss, lump/mass, sore throat and trouble swallowing.   Eyes: Negative for eye problems and icterus.  Respiratory: Negative for chest tightness, cough and shortness of breath.  Cardiovascular: Negative for chest pain, leg swelling and palpitations.  Gastrointestinal: Negative for abdominal distention, abdominal pain, constipation, diarrhea, nausea and vomiting.  Endocrine: Negative for hot  flashes.  Skin: Negative for itching and rash.  Neurological: Negative for dizziness, extremity weakness and headaches.  Hematological: Negative for adenopathy. Does not bruise/bleed easily.  Psychiatric/Behavioral: Negative for depression. The patient is not nervous/anxious.   Breast: Denies any new nodularity, masses, tenderness, nipple changes, or nipple discharge.       PAST MEDICAL/SURGICAL HISTORY:  Past Medical History:  Diagnosis Date  . Allergy   . Breast cancer ([place])    bilateral   . Cancer ([place]) 1999&2006   breast cancer x2   . COPD (chronic obstructive pulmonary disease) ([place])   . Family history of breast cancer   . Heart murmur 1999  . Neuropathy   . Neuropathy due to chemotherapeutic drug [facility])    breast cancer , Dr Truddie Coco.    Past Surgical History:  Procedure Laterality Date  . APPENDECTOMY    . BREAST RECONSTRUCTION    . CARPAL TUNNEL RELEASE     right  . ECTOPIC PREGNANCY SURGERY    . LYMPH GLAND EXCISION     left arm  . MASTECTOMY  2006   bil.mastectomy with abdominal trans flap.  . OVARY SURGERY     after ectopic sx  . TONSILLECTOMY  as child  . TOTAL ABDOMINAL HYSTERECTOMY  2001     ALLERGIES:  Allergies  Allergen Reactions  . Gadolinium Derivatives Shortness Of Breath and Swelling    Given for xray  . Penicillins Shortness Of Breath  . Ivp Dye [Iodinated Diagnostic Agents]   . Pepto-Bismol [Bismuth Subsalicylate] Swelling     CURRENT MEDICATIONS:  Outpatient Encounter Medications as of 03/02/2018  Medication Sig  . Cholecalciferol (VITAMIN D PO) Take 5,000 Units by mouth daily.  . cyclobenzaprine (FLEXERIL) 10 MG tablet Take 1-2 tablets (10-20 mg total) by mouth daily as needed (1-2 tabs as needed).  . gabapentin (NEURONTIN) 600 MG tablet Take 1 tablet (600 mg total) by mouth 3 (three) times daily.  . Multiple Vitamin (MULTIVITAMIN) tablet Take 1 tablet by mouth daily.  . Oxycodone HCl 20 MG TABS take 1 tablet by mouth every 4 hours if  needed   No facility-administered encounter medications on file as of 03/02/2018.      ONCOLOGIC FAMILY HISTORY:  Family History  Problem Relation Age of Onset  . Colon polyps Sister 41  . Diabetes Sister   . Diabetes Mother   . Multiple sclerosis Mother   . Heart attack Mother   . Stroke Mother   . Diabetes Father   . Heart attack Father   . Cancer Father        Kidney cancer  . Breast cancer Paternal Aunt        dx in her 32s  . Dementia Maternal Grandmother   . Heart attack Maternal Grandfather   . Pulmonary embolism Paternal Grandmother   . Cancer Paternal Grandfather        unknown type  . Breast cancer Cousin        paternal first cousin dx under 36  . Colon cancer Neg Hx       SOCIAL HISTORY:  Social History   Socioeconomic History  . Marital status: Married    Spouse name: Tamara Webb   . Number of children: 0  . Years of education: 12+  . Highest education level: Not on file  Occupational History  Employer: UNEMPLOYED  Social Needs  . Financial resource strain: Not on file  . Food insecurity:    Worry: Not on file    Inability: Not on file  . Transportation needs:    Medical: Not on file    Non-medical: Not on file  Tobacco Use  . Smoking status: Current Every Day Smoker    Packs/day: 1.00    Types: Cigarettes  . Smokeless tobacco: Never Used  . Tobacco comment: quit for 20 years,just started back in 2000  Substance and Sexual Activity  . Alcohol use: No    Alcohol/week: 0.0 oz  . Drug use: No  . Sexual activity: Yes    Birth control/protection: Surgical  Lifestyle  . Physical activity:    Days per week: Not on file    Minutes per session: Not on file  . Stress: Not on file  Relationships  . Social connections:    Talks on phone: Not on file    Gets together: Not on file    Attends religious service: Not on file    Active member of club or organization: Not on file    Attends meetings of clubs or organizations: Not on file    Relationship  status: Not on file  . Intimate partner violence:    Fear of current or ex partner: Not on file    Emotionally abused: Not on file    Physically abused: Not on file    Forced sexual activity: Not on file  Other Topics Concern  . Not on file  Social History Narrative   Patient lives with Tamara Webb her husband.    Patient has no children.    Patient is currently not working.    Patient has some college.       PHYSICAL EXAMINATION:  Vital Signs: Vitals:   03/02/18 1449  BP: (!) 158/86  Pulse: 86  Resp: 18  Temp: 98.2 F (36.8 C)  SpO2: 100%   Filed Weights   03/02/18 1449  Weight: 120 lb 6.4 oz (54.6 kg)   General: Well-nourished, well-appearing female in no acute distress.  accompanied by her spouse today.   HEENT: Head is normocephalic.  Pupils equal and reactive to light. Conjunctivae clear without exudate.  Sclerae anicteric. Oral mucosa is pink, moist.  Oropharynx is pink without lesions or erythema.  Lymph: No cervical, supraclavicular, or infraclavicular lymphadenopathy noted on palpation.  Cardiovascular: Regular rate and rhythm.Marland Kitchen Respiratory: Clear to auscultation bilaterally. Chest expansion symmetric; breathing non-labored.  Breast Exam: bilateral breasts s/p mastectomy and implant placement, no nodules, masses or sign of recurrence -Axilla: No axillary adenopathy bilaterally.  GI: Abdomen soft and round; non-tender, non-distended. Bowel sounds normoactive. No hepatosplenomegaly.   GU: Deferred.  Neuro: No focal deficits. Steady gait.  Psych: Mood and affect normal and appropriate for situation.  MSK: No focal spinal tenderness to palpation, full range of motion in bilateral upper extremities Extremities: No edema. Skin: Warm and dry. Several abnormal nevi noted on skin on nose and arms/face,   LABORATORY DATA:  None for this visit   DIAGNOSTIC IMAGING:  Most recent mammogram: n/a s/p bilateral mastectomies    ASSESSMENT AND PLAN:  Ms.. Webb is a pleasant  62 y.o. female with history of recurrent breast cancer last in 2006.  She presents to the Survivorship Clinic for surveillance and routine follow-up.   1. History of breast cancer:  Tamara Webb is currently clinically without evidence of disease or recurrence of breast cancer. We reviewed the role  of the LTS clinic and the fact that we can follow her up in regards to her care, and make cancer surveillance recommendations, however her PCP should remain the main "hub" of her health care at this point in her survivorship.  I highly recommended for her to establish with a PCP.  I encouraged her to call me with any questions or concerns before her next visit at the cancer center, and I would be happy to see her sooner, if needed.    2. Cancer screening:  Due to Tamara Webb's history and her age, she should receive screening for skin cancers, colon cancer, and gynecologic cancers. She was encouraged to follow-up with her PCP for appropriate cancer screenings. She is not quite yet eligible for lung cancer screenings, however I told her she would be if she is still smoking in 4 more years.    3. Health maintenance and wellness promotion: Tamara Webb was encouraged to consume 5-7 servings of fruits and vegetables per day. She was also encouraged to engage in moderate to vigorous exercise for 30 minutes per day most days of the week. She was instructed to limit her alcohol consumption and was encouraged stop smoking.    4. Skin lesion: I sent her to dermatology for an abnormal skin lesion on her nose.  She has several atypical nevi she should have evaluated by dermatology.      Dispo:  -Return to cancer center in one year for LTS follow up.   A total of (30) minutes of face-to-face time was spent with this patient with greater than 50% of that time in counseling and care-coordination.   Tamara Webb, [place] [phone]   Note: PRIMARY CARE  PROVIDER Janifer Adie, [place] [phone]

## 2018-03-02 NOTE — Telephone Encounter (Signed)
Gave patient avs report and appointments for June 2020. Referral to dermatology sent to [facility] via [facility]. Patient aware practice will contact her re appointment.

## 2018-03-03 ENCOUNTER — Telehealth: Payer: Self-pay | Admitting: Adult Health

## 2018-03-03 NOTE — Telephone Encounter (Signed)
Faxed records to [facility] dermatology [phone]

## 2018-03-04 DIAGNOSIS — G894 Chronic pain syndrome: Secondary | ICD-10-CM | POA: Diagnosis not present

## 2018-03-04 DIAGNOSIS — Z79891 Long term (current) use of opiate analgesic: Secondary | ICD-10-CM | POA: Diagnosis not present

## 2018-03-04 DIAGNOSIS — G620 Drug-induced polyneuropathy: Secondary | ICD-10-CM | POA: Diagnosis not present

## 2018-03-04 DIAGNOSIS — N644 Mastodynia: Secondary | ICD-10-CM | POA: Diagnosis not present

## 2018-04-01 DIAGNOSIS — N644 Mastodynia: Secondary | ICD-10-CM | POA: Diagnosis not present

## 2018-04-01 DIAGNOSIS — Z79891 Long term (current) use of opiate analgesic: Secondary | ICD-10-CM | POA: Diagnosis not present

## 2018-04-01 DIAGNOSIS — G620 Drug-induced polyneuropathy: Secondary | ICD-10-CM | POA: Diagnosis not present

## 2018-04-01 DIAGNOSIS — G894 Chronic pain syndrome: Secondary | ICD-10-CM | POA: Diagnosis not present

## 2018-04-30 DIAGNOSIS — N644 Mastodynia: Secondary | ICD-10-CM | POA: Diagnosis not present

## 2018-04-30 DIAGNOSIS — G894 Chronic pain syndrome: Secondary | ICD-10-CM | POA: Diagnosis not present

## 2018-04-30 DIAGNOSIS — G620 Drug-induced polyneuropathy: Secondary | ICD-10-CM | POA: Diagnosis not present

## 2018-04-30 DIAGNOSIS — Z79891 Long term (current) use of opiate analgesic: Secondary | ICD-10-CM | POA: Diagnosis not present

## 2018-06-01 DIAGNOSIS — Z79891 Long term (current) use of opiate analgesic: Secondary | ICD-10-CM | POA: Diagnosis not present

## 2018-06-01 DIAGNOSIS — G894 Chronic pain syndrome: Secondary | ICD-10-CM | POA: Diagnosis not present

## 2018-06-01 DIAGNOSIS — G620 Drug-induced polyneuropathy: Secondary | ICD-10-CM | POA: Diagnosis not present

## 2018-06-01 DIAGNOSIS — N644 Mastodynia: Secondary | ICD-10-CM | POA: Diagnosis not present

## 2018-06-24 DIAGNOSIS — G620 Drug-induced polyneuropathy: Secondary | ICD-10-CM | POA: Diagnosis not present

## 2018-06-24 DIAGNOSIS — Z79891 Long term (current) use of opiate analgesic: Secondary | ICD-10-CM | POA: Diagnosis not present

## 2018-06-24 DIAGNOSIS — G894 Chronic pain syndrome: Secondary | ICD-10-CM | POA: Diagnosis not present

## 2018-06-24 DIAGNOSIS — N644 Mastodynia: Secondary | ICD-10-CM | POA: Diagnosis not present

## 2018-07-22 DIAGNOSIS — N644 Mastodynia: Secondary | ICD-10-CM | POA: Diagnosis not present

## 2018-07-22 DIAGNOSIS — G894 Chronic pain syndrome: Secondary | ICD-10-CM | POA: Diagnosis not present

## 2018-07-22 DIAGNOSIS — G620 Drug-induced polyneuropathy: Secondary | ICD-10-CM | POA: Diagnosis not present

## 2018-07-22 DIAGNOSIS — Z79891 Long term (current) use of opiate analgesic: Secondary | ICD-10-CM | POA: Diagnosis not present

## 2018-07-29 DIAGNOSIS — D1801 Hemangioma of skin and subcutaneous tissue: Secondary | ICD-10-CM | POA: Diagnosis not present

## 2018-07-29 DIAGNOSIS — L570 Actinic keratosis: Secondary | ICD-10-CM | POA: Diagnosis not present

## 2018-07-29 DIAGNOSIS — L821 Other seborrheic keratosis: Secondary | ICD-10-CM | POA: Diagnosis not present

## 2018-07-29 DIAGNOSIS — Z85828 Personal history of other malignant neoplasm of skin: Secondary | ICD-10-CM | POA: Diagnosis not present

## 2018-08-19 DIAGNOSIS — Z79891 Long term (current) use of opiate analgesic: Secondary | ICD-10-CM | POA: Diagnosis not present

## 2018-08-19 DIAGNOSIS — G894 Chronic pain syndrome: Secondary | ICD-10-CM | POA: Diagnosis not present

## 2018-08-19 DIAGNOSIS — N644 Mastodynia: Secondary | ICD-10-CM | POA: Diagnosis not present

## 2018-08-19 DIAGNOSIS — G620 Drug-induced polyneuropathy: Secondary | ICD-10-CM | POA: Diagnosis not present

## 2018-09-20 DIAGNOSIS — Z79891 Long term (current) use of opiate analgesic: Secondary | ICD-10-CM | POA: Diagnosis not present

## 2018-09-20 DIAGNOSIS — N644 Mastodynia: Secondary | ICD-10-CM | POA: Diagnosis not present

## 2018-09-20 DIAGNOSIS — G620 Drug-induced polyneuropathy: Secondary | ICD-10-CM | POA: Diagnosis not present

## 2018-09-20 DIAGNOSIS — G894 Chronic pain syndrome: Secondary | ICD-10-CM | POA: Diagnosis not present

## 2018-10-13 DIAGNOSIS — G894 Chronic pain syndrome: Secondary | ICD-10-CM | POA: Diagnosis not present

## 2018-10-13 DIAGNOSIS — Z79891 Long term (current) use of opiate analgesic: Secondary | ICD-10-CM | POA: Diagnosis not present

## 2018-10-13 DIAGNOSIS — N644 Mastodynia: Secondary | ICD-10-CM | POA: Diagnosis not present

## 2018-10-13 DIAGNOSIS — G620 Drug-induced polyneuropathy: Secondary | ICD-10-CM | POA: Diagnosis not present

## 2018-11-10 DIAGNOSIS — Z79891 Long term (current) use of opiate analgesic: Secondary | ICD-10-CM | POA: Diagnosis not present

## 2018-11-10 DIAGNOSIS — N644 Mastodynia: Secondary | ICD-10-CM | POA: Diagnosis not present

## 2018-11-10 DIAGNOSIS — G894 Chronic pain syndrome: Secondary | ICD-10-CM | POA: Diagnosis not present

## 2018-11-10 DIAGNOSIS — G620 Drug-induced polyneuropathy: Secondary | ICD-10-CM | POA: Diagnosis not present

## 2018-12-09 DIAGNOSIS — M4125 Other idiopathic scoliosis, thoracolumbar region: Secondary | ICD-10-CM | POA: Diagnosis not present

## 2018-12-09 DIAGNOSIS — M62830 Muscle spasm of back: Secondary | ICD-10-CM | POA: Diagnosis not present

## 2018-12-09 DIAGNOSIS — Z79891 Long term (current) use of opiate analgesic: Secondary | ICD-10-CM | POA: Diagnosis not present

## 2018-12-09 DIAGNOSIS — R079 Chest pain, unspecified: Secondary | ICD-10-CM | POA: Diagnosis not present

## 2018-12-09 DIAGNOSIS — N644 Mastodynia: Secondary | ICD-10-CM | POA: Diagnosis not present

## 2018-12-09 DIAGNOSIS — G620 Drug-induced polyneuropathy: Secondary | ICD-10-CM | POA: Diagnosis not present

## 2018-12-09 DIAGNOSIS — G894 Chronic pain syndrome: Secondary | ICD-10-CM | POA: Diagnosis not present

## 2018-12-09 DIAGNOSIS — M940 Chondrocostal junction syndrome [Tietze]: Secondary | ICD-10-CM | POA: Diagnosis not present

## 2019-01-06 DIAGNOSIS — N644 Mastodynia: Secondary | ICD-10-CM | POA: Diagnosis not present

## 2019-01-06 DIAGNOSIS — G620 Drug-induced polyneuropathy: Secondary | ICD-10-CM | POA: Diagnosis not present

## 2019-01-06 DIAGNOSIS — G894 Chronic pain syndrome: Secondary | ICD-10-CM | POA: Diagnosis not present

## 2019-01-06 DIAGNOSIS — Z79891 Long term (current) use of opiate analgesic: Secondary | ICD-10-CM | POA: Diagnosis not present

## 2019-02-03 DIAGNOSIS — G620 Drug-induced polyneuropathy: Secondary | ICD-10-CM | POA: Diagnosis not present

## 2019-02-03 DIAGNOSIS — Z79891 Long term (current) use of opiate analgesic: Secondary | ICD-10-CM | POA: Diagnosis not present

## 2019-02-03 DIAGNOSIS — R079 Chest pain, unspecified: Secondary | ICD-10-CM | POA: Diagnosis not present

## 2019-02-03 DIAGNOSIS — G894 Chronic pain syndrome: Secondary | ICD-10-CM | POA: Diagnosis not present

## 2019-02-03 DIAGNOSIS — M62830 Muscle spasm of back: Secondary | ICD-10-CM | POA: Diagnosis not present

## 2019-02-03 DIAGNOSIS — M4125 Other idiopathic scoliosis, thoracolumbar region: Secondary | ICD-10-CM | POA: Diagnosis not present

## 2019-02-03 DIAGNOSIS — N644 Mastodynia: Secondary | ICD-10-CM | POA: Diagnosis not present

## 2019-02-03 DIAGNOSIS — M940 Chondrocostal junction syndrome [Tietze]: Secondary | ICD-10-CM | POA: Diagnosis not present

## 2019-02-15 ENCOUNTER — Inpatient Hospital Stay: Payer: Medicare Other | Attending: Adult Health | Admitting: Adult Health

## 2019-02-15 ENCOUNTER — Other Ambulatory Visit: Payer: Self-pay

## 2019-02-15 VITALS — BP 150/77 | HR 87 | Temp 97.9°F | Resp 17 | Ht 65.0 in | Wt 121.7 lb

## 2019-02-15 DIAGNOSIS — G629 Polyneuropathy, unspecified: Secondary | ICD-10-CM | POA: Diagnosis not present

## 2019-02-15 DIAGNOSIS — M940 Chondrocostal junction syndrome [Tietze]: Secondary | ICD-10-CM | POA: Diagnosis not present

## 2019-02-15 DIAGNOSIS — G8929 Other chronic pain: Secondary | ICD-10-CM | POA: Insufficient documentation

## 2019-02-15 DIAGNOSIS — J449 Chronic obstructive pulmonary disease, unspecified: Secondary | ICD-10-CM | POA: Diagnosis not present

## 2019-02-15 DIAGNOSIS — Z923 Personal history of irradiation: Secondary | ICD-10-CM | POA: Insufficient documentation

## 2019-02-15 DIAGNOSIS — M8580 Other specified disorders of bone density and structure, unspecified site: Secondary | ICD-10-CM | POA: Diagnosis not present

## 2019-02-15 DIAGNOSIS — F17210 Nicotine dependence, cigarettes, uncomplicated: Secondary | ICD-10-CM | POA: Diagnosis not present

## 2019-02-15 DIAGNOSIS — N6331 Unspecified lump in axillary tail of the right breast: Secondary | ICD-10-CM | POA: Insufficient documentation

## 2019-02-15 DIAGNOSIS — Z853 Personal history of malignant neoplasm of breast: Secondary | ICD-10-CM | POA: Diagnosis not present

## 2019-02-15 DIAGNOSIS — R011 Cardiac murmur, unspecified: Secondary | ICD-10-CM | POA: Diagnosis not present

## 2019-02-15 DIAGNOSIS — Z9013 Acquired absence of bilateral breasts and nipples: Secondary | ICD-10-CM | POA: Insufficient documentation

## 2019-02-15 DIAGNOSIS — Z79899 Other long term (current) drug therapy: Secondary | ICD-10-CM

## 2019-02-15 DIAGNOSIS — C50912 Malignant neoplasm of unspecified site of left female breast: Secondary | ICD-10-CM

## 2019-02-15 NOTE — Progress Notes (Signed)
CLINIC:  Survivorship   REASON FOR VISIT:  Routine follow-up for history of breast cancer.   BRIEF ONCOLOGIC HISTORY:  Per Dr Jana Hakim last note from 04/16/2015: Tamara Webb is a 63 y.o.[place], [place] woman diagnosed with left-sided breast cancer in 1998  1. She was treated with 4 cycles of neoadjuvant chemotherapy consisting of AC (Adriamycin/Cytoxan) completed in 10/1997.  2. She is status post left breast lumpectomy in 06/1998 for aT2 N0, stage IIA invasive carcinoma, ER positive, PR negative.  3. She is status post left breast radiation therapy, completed in 10/1998.   4. Radiation therapy was followed by 5 years of adjuvant hormonal therapy with various antiestrogen agents.   5.She underwent total bilateral hysterectomy/bilateral salpingo-oophorectomy in 08/2001.   6. Recurrent disease in the left breast diagnosed by corebiopsy 11/12/2004 which showed invasive mammary carcinoma.  7. Status post left breast simple mastectomy and right breast simple mastectomy with left axillary sentinel node biopsy on 01/06/2005 for a left breast, pT1c, pN0(i-)(sn), stage I invasive ductal carcinoma with lobular features, grade 2, estrogen receptor negative, progesterone receptor negative, Ki-67 6%, HER-2/neu by FISH no amplification(right breast showed no evidence of malignancy)  8. The patient began adjuvant chemotherapy with TC (Taxotere/Cytoxan) on 02/20/2005, completing4 cycles   9. Status post bilateral breast reconstructive surgery with TRAM flaps in 06/2005.  10. Chronic pain (followed by Dr Nicholaus Bloom.)  11. Tobacco abuse (patient was again counseled on smoking cessation)  12. Osteopenia,   INTERVAL HISTORY:  Tamara Webb presents to the [facility] today for routine follow-up for her history of breast cancer.  Overall, she reports feeling quite well.  We were going to do this visit virtually, however she noted a new lump in her  rightr breast that she thought we should know about.  I moved her appointment up so we could evaluate.  [name] has noted this area x 2 months.  She says she feels like it is getting larger.  This lump is in the area of her previous right breast mastectomy where she had RAM flap reconstruction.  She denies pain, erythema, swelling, drainage.  She notes that she had a lump that popped up in her right arm.  This happened in her left arm and it had to be removed.  She says that an orthopedic surgeon had to remove it.    [name] still sees Dr. Hardin Negus for pain.  She had a lymph node that was noted on breast ultrasound that was removed by Dr. Marlou Starks in 05/2017.  She sees Dr. Bradd Burner, she notes she hasn't seen them in a long time.  She says she needs to see someone closer to her, or get a repeat appointment.  Junella does yard work and stays active that way.  She does not intentionally exercise.  She notes that she went last year to dermatology and was given a script for her nose and she is due for repeat visit in 03/2019.  She notes that she has delayed getting a repeat colonoscopy.  She was supposed to repeat one in 06/2013 per her report from her interaction with Dr. Sharlett Iles.  She says that she has not followed up as recommended.  She denies any barriers to making these appointments such as financial/transportation, etc. [name] is a current every day smoker calculated to be 27 pack years (see my note from 03/02/2018 for calculation).  She still wants to quit.  She is not yet ready to quit.   [name] is on disability due  to her chronic pain and costochondritis.     REVIEW OF SYSTEMS:  Review of Systems  Constitutional: Negative for appetite change, chills, fatigue, fever and unexpected weight change.  HENT:   Negative for hearing loss, lump/mass and sore throat.   Eyes: Negative for eye problems and icterus.  Respiratory: Negative for chest tightness, cough and shortness of breath.   Cardiovascular: Negative for  chest pain, leg swelling and palpitations.  Gastrointestinal: Negative for abdominal distention, abdominal pain, blood in stool, constipation, diarrhea, nausea and vomiting.  Endocrine: Negative for hot flashes.  Skin: Negative for itching and rash.  Neurological: Negative for dizziness, extremity weakness, headaches and numbness.  Hematological: Negative for adenopathy. Does not bruise/bleed easily.  Psychiatric/Behavioral: Negative for depression. The patient is not nervous/anxious.       PAST MEDICAL/SURGICAL HISTORY:  Past Medical History:  Diagnosis Date  . Allergy   . Breast cancer ([place])    bilateral   . Cancer ([place]) 1999&2006   breast cancer x2   . COPD (chronic obstructive pulmonary disease) ([place])   . Family history of breast cancer   . Heart murmur 1999  . Neuropathy   . Neuropathy due to chemotherapeutic drug [facility])    breast cancer , Dr Truddie Coco.    Past Surgical History:  Procedure Laterality Date  . APPENDECTOMY    . BREAST RECONSTRUCTION    . CARPAL TUNNEL RELEASE     right  . ECTOPIC PREGNANCY SURGERY    . LYMPH GLAND EXCISION     left arm  . MASTECTOMY  2006   bil.mastectomy with abdominal trans flap.  . OVARY SURGERY     after ectopic sx  . TONSILLECTOMY  as child  . TOTAL ABDOMINAL HYSTERECTOMY  2001     ALLERGIES:  Allergies  Allergen Reactions  . Gadolinium Derivatives Shortness Of Breath and Swelling    Given for xray  . Penicillins Shortness Of Breath  . Ivp Dye [Iodinated Diagnostic Agents]   . Pepto-Bismol [Bismuth Subsalicylate] Swelling     CURRENT MEDICATIONS:  Outpatient Encounter Medications as of 02/15/2019  Medication Sig  . Cholecalciferol (VITAMIN D PO) Take 5,000 Units by mouth daily.  . cyclobenzaprine (FLEXERIL) 10 MG tablet Take 1-2 tablets (10-20 mg total) by mouth daily as needed (1-2 tabs as needed).  . gabapentin (NEURONTIN) 600 MG tablet Take 1 tablet (600 mg total) by mouth 3 (three) times daily.  . Multiple Vitamin  (MULTIVITAMIN) tablet Take 1 tablet by mouth daily.  . Oxycodone HCl 20 MG TABS take 1 tablet by mouth every 4 hours if needed   No facility-administered encounter medications on file as of 02/15/2019.      ONCOLOGIC FAMILY HISTORY:  Family History  Problem Relation Age of Onset  . Colon polyps Sister 61  . Diabetes Sister   . Diabetes Mother   . Multiple sclerosis Mother   . Heart attack Mother   . Stroke Mother   . Diabetes Father   . Heart attack Father   . Cancer Father        Kidney cancer  . Breast cancer Paternal Aunt        dx in her 60s  . Dementia Maternal Grandmother   . Heart attack Maternal Grandfather   . Pulmonary embolism Paternal Grandmother   . Cancer Paternal Grandfather        unknown type  . Breast cancer Cousin        paternal first cousin dx under 76  .  Colon cancer Neg Hx     GENETIC COUNSELING/TESTING:   SOCIAL HISTORY:  Social History   Socioeconomic History  . Marital status: Married    Spouse name: [name]   . Number of children: 0  . Years of education: 12+  . Highest education level: Not on file  Occupational History    Employer: UNEMPLOYED  Social Needs  . Financial resource strain: Not on file  . Food insecurity:    Worry: Not on file    Inability: Not on file  . Transportation needs:    Medical: Not on file    Non-medical: Not on file  Tobacco Use  . Smoking status: Current Every Day Smoker    Packs/day: 1.00    Types: Cigarettes  . Smokeless tobacco: Never Used  . Tobacco comment: quit for 20 years,just started back in 2000  Substance and Sexual Activity  . Alcohol use: No    Alcohol/week: 0.0 standard drinks  . Drug use: No  . Sexual activity: Yes    Birth control/protection: Surgical  Lifestyle  . Physical activity:    Days per week: Not on file    Minutes per session: Not on file  . Stress: Not on file  Relationships  . Social connections:    Talks on phone: Not on file    Gets together: Not on file    Attends  religious service: Not on file    Active member of club or organization: Not on file    Attends meetings of clubs or organizations: Not on file    Relationship status: Not on file  . Intimate partner violence:    Fear of current or ex partner: Not on file    Emotionally abused: Not on file    Physically abused: Not on file    Forced sexual activity: Not on file  Other Topics Concern  . Not on file  Social History Narrative   Patient lives with [name] her husband.    Patient has no children.    Patient is currently not working.    Patient has some college.      PHYSICAL EXAMINATION:  Vital Signs: Vitals:   02/15/19 1154  BP: (!) 150/77  Pulse: 87  Resp: 17  Temp: 97.9 F (36.6 C)  SpO2: 99%   Filed Weights   02/15/19 1154  Weight: 121 lb 11.2 oz (55.2 kg)   General: Well-nourished, well-appearing female in no acute distress.  Unaccompanied today.   HEENT: Head is normocephalic.  Pupils equal and reactive to light. Conjunctivae clear without exudate.  Sclerae anicteric. Oral mucosa is pink, moist.  Oropharynx is pink without lesions or erythema.  Lymph: No cervical, supraclavicular, or infraclavicular lymphadenopathy noted on palpation.  Cardiovascular: Regular rate and rhythm.Marland Kitchen Respiratory: Clear to auscultation bilaterally. Chest expansion symmetric; breathing non-labored.  Breast Exam:  Right breast s/p mastectomy with implant placement, small subcm nodule noted at chest wall where breast meets the chest wall, between 4 and 5 o'clock.  Left breast s/p tramflap reconstruction, no sign of recurrence.  -Axilla: No axillary adenopathy bilaterally.  GI: Abdomen soft and round; non-tender, non-distended. Bowel sounds normoactive. No hepatosplenomegaly.   GU: Deferred.  Neuro: No focal deficits. Steady gait.  Psych: Mood and affect normal and appropriate for situation.  MSK: No focal spinal tenderness to palpation, full range of motion in bilateral upper extremities  Extremities: No edema. Skin: Warm and dry.  LABORATORY DATA:  None for this visit   DIAGNOSTIC IMAGING:  Most  recent mammogram: n/a, s/p bilateral mastectomies    ASSESSMENT AND PLAN:  Ms.. Heuberger is a pleasant 63 y.o. female with history of recurrent breast cancer, most recently in 2006.  She presents to the Survivorship Clinic for surveillance and routine follow-up.   1. History of breast cancer:  Tamara Webb is currently clinically and radiographically without evidence of disease or recurrence of breast cancer.  I again recommended that she see her PCP regularly.  We will see her back in one year for continued monitoring.  I encouraged her to call me with any questions or concerns before her next visit at the cancer center, and I would be happy to see her sooner, if needed.    2. Right breast nodule: Ultrasound of the left breast/chest wall ordered.    3. Left arm nodule: I recommended she see Dr. Marlou Starks for evaluation and removal.  4. Bone health:  Given Ms. Bastien's age, history of breast cancer, she is at risk for bone demineralization. Marland Kitchen  She was given education on specific food and activities to promote bone health.  5. Cancer screening:  Due to Ms. Richens's history and her age, she should receive screening for skin cancers, colon cancer, and gynecologic cancers. She was encouraged to follow-up with her PCP for appropriate cancer screenings.   6. Health maintenance and wellness promotion: Ms. Hilgert was encouraged to consume 5-7 servings of fruits and vegetables per day. She was also encouraged to engage in moderate to vigorous exercise for 30 minutes per day most days of the week. She was instructed to limit her alcohol consumption and was encouraged to quit smoking.      Dispo:  -Return to cancer center in one year for LTS follow up -Right breast ultrasound   A total of (30) minutes of face-to-face time was spent with this patient with greater than 50% of that time in  counseling and care-coordination.   Gardenia Phlegm, [place] [phone]   Note: PRIMARY CARE PROVIDER Janifer Adie, [place] [phone]

## 2019-02-16 ENCOUNTER — Telehealth: Payer: Self-pay | Admitting: Adult Health

## 2019-02-16 NOTE — Telephone Encounter (Signed)
I talk with patient regarding schedule  

## 2019-02-17 ENCOUNTER — Encounter: Payer: Self-pay | Admitting: Adult Health

## 2019-03-01 ENCOUNTER — Encounter: Payer: Medicare Other | Admitting: Adult Health

## 2019-03-01 ENCOUNTER — Other Ambulatory Visit: Payer: Medicare Other

## 2019-03-03 DIAGNOSIS — Z79891 Long term (current) use of opiate analgesic: Secondary | ICD-10-CM | POA: Diagnosis not present

## 2019-03-03 DIAGNOSIS — N644 Mastodynia: Secondary | ICD-10-CM | POA: Diagnosis not present

## 2019-03-03 DIAGNOSIS — G620 Drug-induced polyneuropathy: Secondary | ICD-10-CM | POA: Diagnosis not present

## 2019-03-03 DIAGNOSIS — G894 Chronic pain syndrome: Secondary | ICD-10-CM | POA: Diagnosis not present

## 2019-03-29 ENCOUNTER — Other Ambulatory Visit: Payer: Self-pay

## 2019-03-29 ENCOUNTER — Ambulatory Visit
Admission: RE | Admit: 2019-03-29 | Discharge: 2019-03-29 | Disposition: A | Discharge status: Home / Self Care | Payer: Medicare Other | Source: Ambulatory Visit | Attending: Adult Health | Admitting: Adult Health

## 2019-03-29 DIAGNOSIS — N6001 Solitary cyst of right breast: Secondary | ICD-10-CM | POA: Diagnosis not present

## 2019-03-29 DIAGNOSIS — C50912 Malignant neoplasm of unspecified site of left female breast: Secondary | ICD-10-CM

## 2019-03-30 DIAGNOSIS — G894 Chronic pain syndrome: Secondary | ICD-10-CM | POA: Diagnosis not present

## 2019-03-30 DIAGNOSIS — G620 Drug-induced polyneuropathy: Secondary | ICD-10-CM | POA: Diagnosis not present

## 2019-03-30 DIAGNOSIS — N644 Mastodynia: Secondary | ICD-10-CM | POA: Diagnosis not present

## 2019-03-30 DIAGNOSIS — Z79891 Long term (current) use of opiate analgesic: Secondary | ICD-10-CM | POA: Diagnosis not present

## 2019-04-27 DIAGNOSIS — G620 Drug-induced polyneuropathy: Secondary | ICD-10-CM | POA: Diagnosis not present

## 2019-04-27 DIAGNOSIS — G894 Chronic pain syndrome: Secondary | ICD-10-CM | POA: Diagnosis not present

## 2019-04-27 DIAGNOSIS — N644 Mastodynia: Secondary | ICD-10-CM | POA: Diagnosis not present

## 2019-04-27 DIAGNOSIS — Z79891 Long term (current) use of opiate analgesic: Secondary | ICD-10-CM | POA: Diagnosis not present

## 2019-05-26 DIAGNOSIS — N644 Mastodynia: Secondary | ICD-10-CM | POA: Diagnosis not present

## 2019-05-26 DIAGNOSIS — G894 Chronic pain syndrome: Secondary | ICD-10-CM | POA: Diagnosis not present

## 2019-05-26 DIAGNOSIS — G620 Drug-induced polyneuropathy: Secondary | ICD-10-CM | POA: Diagnosis not present

## 2019-05-26 DIAGNOSIS — Z79891 Long term (current) use of opiate analgesic: Secondary | ICD-10-CM | POA: Diagnosis not present

## 2019-06-23 DIAGNOSIS — Z79891 Long term (current) use of opiate analgesic: Secondary | ICD-10-CM | POA: Diagnosis not present

## 2019-06-23 DIAGNOSIS — G620 Drug-induced polyneuropathy: Secondary | ICD-10-CM | POA: Diagnosis not present

## 2019-06-23 DIAGNOSIS — N644 Mastodynia: Secondary | ICD-10-CM | POA: Diagnosis not present

## 2019-06-23 DIAGNOSIS — G894 Chronic pain syndrome: Secondary | ICD-10-CM | POA: Diagnosis not present

## 2019-07-20 DIAGNOSIS — G894 Chronic pain syndrome: Secondary | ICD-10-CM | POA: Diagnosis not present

## 2019-07-20 DIAGNOSIS — N644 Mastodynia: Secondary | ICD-10-CM | POA: Diagnosis not present

## 2019-07-20 DIAGNOSIS — G620 Drug-induced polyneuropathy: Secondary | ICD-10-CM | POA: Diagnosis not present

## 2019-07-20 DIAGNOSIS — Z79891 Long term (current) use of opiate analgesic: Secondary | ICD-10-CM | POA: Diagnosis not present

## 2019-08-03 DIAGNOSIS — L570 Actinic keratosis: Secondary | ICD-10-CM | POA: Diagnosis not present

## 2019-08-03 DIAGNOSIS — D2372 Other benign neoplasm of skin of left lower limb, including hip: Secondary | ICD-10-CM | POA: Diagnosis not present

## 2019-08-03 DIAGNOSIS — L821 Other seborrheic keratosis: Secondary | ICD-10-CM | POA: Diagnosis not present

## 2019-08-03 DIAGNOSIS — L300 Nummular dermatitis: Secondary | ICD-10-CM | POA: Diagnosis not present

## 2019-08-03 DIAGNOSIS — D225 Melanocytic nevi of trunk: Secondary | ICD-10-CM | POA: Diagnosis not present

## 2019-08-03 DIAGNOSIS — Z85828 Personal history of other malignant neoplasm of skin: Secondary | ICD-10-CM | POA: Diagnosis not present

## 2020-01-04 DIAGNOSIS — Z79891 Long term (current) use of opiate analgesic: Secondary | ICD-10-CM | POA: Diagnosis not present

## 2020-01-04 DIAGNOSIS — G894 Chronic pain syndrome: Secondary | ICD-10-CM | POA: Diagnosis not present

## 2020-01-04 DIAGNOSIS — N644 Mastodynia: Secondary | ICD-10-CM | POA: Diagnosis not present

## 2020-01-04 DIAGNOSIS — G620 Drug-induced polyneuropathy: Secondary | ICD-10-CM | POA: Diagnosis not present

## 2020-02-01 DIAGNOSIS — G894 Chronic pain syndrome: Secondary | ICD-10-CM | POA: Diagnosis not present

## 2020-02-01 DIAGNOSIS — Z79891 Long term (current) use of opiate analgesic: Secondary | ICD-10-CM | POA: Diagnosis not present

## 2020-02-01 DIAGNOSIS — G620 Drug-induced polyneuropathy: Secondary | ICD-10-CM | POA: Diagnosis not present

## 2020-02-01 DIAGNOSIS — M62830 Muscle spasm of back: Secondary | ICD-10-CM | POA: Diagnosis not present

## 2020-02-10 ENCOUNTER — Telehealth: Payer: Self-pay | Admitting: Adult Health

## 2020-02-10 NOTE — Telephone Encounter (Signed)
Rescheduled appts per provider PAL. Pt confirmed new appt date and time.  

## 2020-02-16 ENCOUNTER — Inpatient Hospital Stay: Payer: Medicare Other | Admitting: Adult Health

## 2020-03-06 ENCOUNTER — Other Ambulatory Visit: Payer: Self-pay

## 2020-03-06 ENCOUNTER — Encounter (INDEPENDENT_AMBULATORY_CARE_PROVIDER_SITE_OTHER): Payer: Self-pay

## 2020-03-06 ENCOUNTER — Inpatient Hospital Stay: Payer: Medicare Other | Attending: Adult Health | Admitting: Adult Health

## 2020-03-06 VITALS — BP 139/80 | HR 79 | Temp 99.1°F | Resp 18 | Ht 65.0 in | Wt 115.8 lb

## 2020-03-06 DIAGNOSIS — F17210 Nicotine dependence, cigarettes, uncomplicated: Secondary | ICD-10-CM | POA: Insufficient documentation

## 2020-03-06 DIAGNOSIS — Z8051 Family history of malignant neoplasm of kidney: Secondary | ICD-10-CM | POA: Diagnosis not present

## 2020-03-06 DIAGNOSIS — Z9221 Personal history of antineoplastic chemotherapy: Secondary | ICD-10-CM | POA: Diagnosis not present

## 2020-03-06 DIAGNOSIS — G8929 Other chronic pain: Secondary | ICD-10-CM | POA: Diagnosis not present

## 2020-03-06 DIAGNOSIS — Z79899 Other long term (current) drug therapy: Secondary | ICD-10-CM | POA: Insufficient documentation

## 2020-03-06 DIAGNOSIS — Z803 Family history of malignant neoplasm of breast: Secondary | ICD-10-CM | POA: Diagnosis not present

## 2020-03-06 DIAGNOSIS — Z923 Personal history of irradiation: Secondary | ICD-10-CM | POA: Insufficient documentation

## 2020-03-06 DIAGNOSIS — Z853 Personal history of malignant neoplasm of breast: Secondary | ICD-10-CM | POA: Insufficient documentation

## 2020-03-06 DIAGNOSIS — Z90722 Acquired absence of ovaries, bilateral: Secondary | ICD-10-CM | POA: Diagnosis not present

## 2020-03-06 DIAGNOSIS — M8580 Other specified disorders of bone density and structure, unspecified site: Secondary | ICD-10-CM | POA: Diagnosis not present

## 2020-03-06 DIAGNOSIS — Z90710 Acquired absence of both cervix and uterus: Secondary | ICD-10-CM | POA: Insufficient documentation

## 2020-03-06 DIAGNOSIS — C50912 Malignant neoplasm of unspecified site of left female breast: Secondary | ICD-10-CM

## 2020-03-06 NOTE — Progress Notes (Signed)
CLINIC:  Survivorship   REASON FOR VISIT:  Routine follow-up for history of breast cancer.   BRIEF ONCOLOGIC HISTORY:  Per Dr Tamara Webb last note from 04/16/2015: Ms. Tamara Webb is a 64 y.o.[place], [place] woman diagnosed with left-sided breast cancer in 1998  1. She was treated with 4 cycles of neoadjuvant chemotherapy consisting of AC (Adriamycin/Cytoxan) completed in 10/1997.  2. She is status post left breast lumpectomy in 06/1998 for aT2 N0, stage IIA invasive carcinoma, ER positive, PR negative.  3. She is status post left breast radiation therapy, completed in 10/1998.   4. Radiation therapy was followed by 5 years of adjuvant hormonal therapy with various antiestrogen agents.   5.She underwent total bilateral hysterectomy/bilateral salpingo-oophorectomy in 08/2001.   6. Recurrent disease in the left breast diagnosed by corebiopsy 11/12/2004 which showed invasive mammary carcinoma.  7. Status post left breast simple mastectomy and right breast simple mastectomy with left axillary sentinel node biopsy on 01/06/2005 for a left breast, pT1c, pN0(i-)(sn), stage I invasive ductal carcinoma with lobular features, grade 2, estrogen receptor negative, progesterone receptor negative, Ki-67 6%, HER-2/neu by FISH no amplification(right breast showed no evidence of malignancy)  8. The patient began adjuvant chemotherapy with TC (Taxotere/Cytoxan) on 02/20/2005, completing4 cycles   9. Status post bilateral breast reconstructive surgery with TRAM flaps in 06/2005.  10. Chronic pain (followed by Dr Tamara Webb.)  11. Tobacco abuse (patient was again counseled on smoking cessation)  12. Osteopenia,   INTERVAL HISTORY:  Tamara Webb presents to the [facility] today for routine follow-up for her history of breast cancer.  Overall, she reports feeling quite well.   Tamara Webb still sees her PCP.  She notes that she continues smoking cigarettes.   She is now at 28 pack years based on our calculations.  She is not yet ready to quit.  She understands that if she continues smoking that in 2 years she will be eligible for CT lung cancer screening.    Tamara Webb notes that she still needs to set up her colon cancer screening.  She is not intentionally exercising, but she remains active by doing yard work.  She has no new concerns, and no breast/chest wall issues today.    She did have an ultrasound of her right breast/axilla on 03/29/2019 and it noted a small oil cyst, this remains, and is not growing.  REVIEW OF SYSTEMS:  Review of Systems  Constitutional: Negative for appetite change, chills, fatigue, fever and unexpected weight change.  HENT:   Negative for hearing loss, lump/mass and sore throat.   Eyes: Negative for eye problems and icterus.  Respiratory: Negative for chest tightness, cough and shortness of breath.   Cardiovascular: Negative for chest pain, leg swelling and palpitations.  Gastrointestinal: Negative for abdominal distention, abdominal pain, blood in stool, constipation, diarrhea, nausea and vomiting.  Endocrine: Negative for hot flashes.  Skin: Negative for itching and rash.  Neurological: Negative for dizziness, extremity weakness, headaches and numbness.  Hematological: Negative for adenopathy. Does not bruise/bleed easily.  Psychiatric/Behavioral: Negative for depression. The patient is not nervous/anxious.       PAST MEDICAL/SURGICAL HISTORY:  Past Medical History:  Diagnosis Date  . Allergy   . Breast cancer ([place])    bilateral   . Cancer ([place]) 1999&2006   breast cancer x2   . COPD (chronic obstructive pulmonary disease) ([place])   . Family history of breast cancer   . Heart murmur 1999  . Neuropathy   . Neuropathy due  to chemotherapeutic drug [facility])    breast cancer , Dr Tamara Webb.    Past Surgical History:  Procedure Laterality Date  . APPENDECTOMY    . BREAST RECONSTRUCTION    . CARPAL TUNNEL RELEASE      right  . ECTOPIC PREGNANCY SURGERY    . LYMPH GLAND EXCISION     left arm  . MASTECTOMY  2006   bil.mastectomy with abdominal trans flap.  . OVARY SURGERY     after ectopic sx  . TONSILLECTOMY  as child  . TOTAL ABDOMINAL HYSTERECTOMY  2001     ALLERGIES:  Allergies  Allergen Reactions  . Gadolinium Derivatives Shortness Of Breath and Swelling    Given for xray  . Penicillins Shortness Of Breath  . Ivp Dye [Iodinated Diagnostic Agents]   . Pepto-Bismol [Bismuth Subsalicylate] Swelling     CURRENT MEDICATIONS:  Outpatient Encounter Medications as of 03/06/2020  Medication Sig  . Cholecalciferol (VITAMIN D PO) Take 5,000 Units by mouth daily.  . cyclobenzaprine (FLEXERIL) 10 MG tablet Take 1-2 tablets (10-20 mg total) by mouth daily as needed (1-2 tabs as needed).  . gabapentin (NEURONTIN) 600 MG tablet Take 1 tablet (600 mg total) by mouth 3 (three) times daily.  . Multiple Vitamin (MULTIVITAMIN) tablet Take 1 tablet by mouth daily.  . Oxycodone HCl 20 MG TABS take 1 tablet by mouth every 4 hours if needed   No facility-administered encounter medications on file as of 03/06/2020.     ONCOLOGIC FAMILY HISTORY:  Family History  Problem Relation Age of Onset  . Colon polyps Sister 40  . Diabetes Sister   . Diabetes Mother   . Multiple sclerosis Mother   . Heart attack Mother   . Stroke Mother   . Diabetes Father   . Heart attack Father   . Cancer Father        Kidney cancer  . Breast cancer Paternal Aunt        dx in her 54s  . Dementia Maternal Grandmother   . Heart attack Maternal Grandfather   . Pulmonary embolism Paternal Grandmother   . Cancer Paternal Grandfather        unknown type  . Breast cancer Cousin        paternal first cousin dx under 34  . Colon cancer Neg Hx     GENETIC COUNSELING/TESTING: 2018  SOCIAL HISTORY:  Social History   Socioeconomic History  . Marital status: Married    Spouse name: Tamara Webb   . Number of children: 0  . Years of  education: 12+  . Highest education level: Not on file  Occupational History    Employer: UNEMPLOYED  Tobacco Use  . Smoking status: Current Every Day Smoker    Packs/day: 1.00    Types: Cigarettes  . Smokeless tobacco: Never Used  . Tobacco comment: quit for 20 years,just started back in 2000  Substance and Sexual Activity  . Alcohol use: No    Alcohol/week: 0.0 standard drinks  . Drug use: No  . Sexual activity: Yes    Birth control/protection: Surgical  Other Topics Concern  . Not on file  Social History Narrative   Patient lives with Tamara Webb her husband.    Patient has no children.    Patient is currently not working.    Patient has some college.    Social Determinants of Health   Financial Resource Strain:   . Difficulty of Paying Living Expenses:   Food Insecurity:   .  Worried About Charity fundraiser in the Last Year:   . Arboriculturist in the Last Year:   Transportation Needs:   . Film/video editor (Medical):   Marland Kitchen Lack of Transportation (Non-Medical):   Physical Activity:   . Days of Exercise per Week:   . Minutes of Exercise per Session:   Stress:   . Feeling of Stress :   Social Connections:   . Frequency of Communication with Friends and Family:   . Frequency of Social Gatherings with Friends and Family:   . Attends Religious Services:   . Active Member of Clubs or Organizations:   . Attends Archivist Meetings:   Marland Kitchen Marital Status:   Intimate Partner Violence:   . Fear of Current or Ex-Partner:   . Emotionally Abused:   Marland Kitchen Physically Abused:   . Sexually Abused:      PHYSICAL EXAMINATION:  Vital Signs: Vitals:   03/06/20 1531  BP: 139/80  Pulse: 79  Resp: 18  Temp: 99.1 F (37.3 C)  SpO2: 100%   Filed Weights   03/06/20 1531  Weight: 115 lb 12.8 oz (52.5 kg)   General: Well-nourished, well-appearing female in no acute distress.  Unaccompanied today.   HEENT: Head is normocephalic.  Pupils equal and reactive to light.  Conjunctivae clear without exudate.  Sclerae anicteric. Oral mucosa is pink, moist.  Oropharynx is pink without lesions or erythema.  Lymph: No cervical, supraclavicular, or infraclavicular lymphadenopathy noted on palpation.  Cardiovascular: Regular rate and rhythm.Marland Kitchen Respiratory: Clear to auscultation bilaterally. Chest expansion symmetric; breathing non-labored.  Breast Exam:  Breasts s/p bilateral mastectomies with no sign of local recurrence.    -Axilla: No axillary adenopathy bilaterally.  GI: Abdomen soft and round; non-tender, non-distended. Bowel sounds normoactive. No hepatosplenomegaly.   GU: Deferred.  Neuro: No focal deficits. Steady gait.  Psych: Mood and affect normal and appropriate for situation.  MSK: No focal spinal tenderness to palpation, full range of motion in bilateral upper extremities Extremities: No edema. Skin: Warm and dry.  LABORATORY DATA:  None for this visit   DIAGNOSTIC IMAGING:  Most recent mammogram: n/a, s/p bilateral mastectomies    ASSESSMENT AND PLAN:  Ms.. Seman is a pleasant 64 y.o. female with history of recurrent breast cancer, most recently in 2006.  She [facility] for surveillance and routine follow-up.   1. History of breast cancer:  Ms. Jaspers is currently clinically and radiographically without evidence of disease or recurrence of breast cancer.  I again recommended that she see her PCP regularly--especially to evaluate her cardiac risk with her tobacco use.  We will see her back in one year for continued monitoring.  I encouraged her to call me with any questions or concerns before her next visit at the cancer center, and I would be happy to see her sooner, if needed.    2. Tobacco use: Recommended smoking cessation, and discussed that in 2 years she will be eligible for CT lung cancer screening.    3. Cancer screening:  Due to Ms. Dunkerson's history and her age, she should receive screening for skin cancers, colon  cancer, and gynecologic cancers. She was encouraged to follow-up with her PCP for appropriate cancer screenings.   4. Health maintenance and wellness promotion: Ms. Teodoro was encouraged to consume 5-7 servings of fruits and vegetables per day. She was also encouraged to engage in moderate to vigorous exercise for 30 minutes per day most days of  the week. She was instructed to limit her alcohol consumption and was encouraged to quit smoking.      Dispo:  -Return to cancer center in one year for LTS follow up  Total encounter time: 20 minutes*    Gardenia Phlegm, NP [place] [phone]  *Total Encounter Time as defined by the Centers for Medicare and Medicaid Services includes, in addition to the face-to-face time of a patient visit (documented in the note above) non-face-to-face time: obtaining and reviewing outside history, ordering and reviewing medications, tests or procedures, care coordination (communications with other health care professionals or caregivers) and documentation in the medical record.   Note: PRIMARY CARE PROVIDER Janifer Adie, [place] [phone]

## 2020-03-07 ENCOUNTER — Encounter: Payer: Self-pay | Admitting: Adult Health

## 2020-03-07 ENCOUNTER — Telehealth: Payer: Self-pay | Admitting: Adult Health

## 2020-03-07 NOTE — Telephone Encounter (Signed)
Scheduled appts per 6/29 los. Pt confirmed appt date and time.  

## 2020-03-28 DIAGNOSIS — Z79891 Long term (current) use of opiate analgesic: Secondary | ICD-10-CM | POA: Diagnosis not present

## 2020-03-28 DIAGNOSIS — G620 Drug-induced polyneuropathy: Secondary | ICD-10-CM | POA: Diagnosis not present

## 2020-03-28 DIAGNOSIS — G894 Chronic pain syndrome: Secondary | ICD-10-CM | POA: Diagnosis not present

## 2020-03-28 DIAGNOSIS — M62830 Muscle spasm of back: Secondary | ICD-10-CM | POA: Diagnosis not present

## 2020-04-04 DIAGNOSIS — Z23 Encounter for immunization: Secondary | ICD-10-CM | POA: Diagnosis not present

## 2020-04-25 DIAGNOSIS — G894 Chronic pain syndrome: Secondary | ICD-10-CM | POA: Diagnosis not present

## 2020-04-25 DIAGNOSIS — G620 Drug-induced polyneuropathy: Secondary | ICD-10-CM | POA: Diagnosis not present

## 2020-04-25 DIAGNOSIS — M62830 Muscle spasm of back: Secondary | ICD-10-CM | POA: Diagnosis not present

## 2020-04-25 DIAGNOSIS — Z79891 Long term (current) use of opiate analgesic: Secondary | ICD-10-CM | POA: Diagnosis not present

## 2020-05-14 DIAGNOSIS — Z23 Encounter for immunization: Secondary | ICD-10-CM | POA: Diagnosis not present

## 2020-05-23 DIAGNOSIS — G894 Chronic pain syndrome: Secondary | ICD-10-CM | POA: Diagnosis not present

## 2020-05-23 DIAGNOSIS — G620 Drug-induced polyneuropathy: Secondary | ICD-10-CM | POA: Diagnosis not present

## 2020-05-23 DIAGNOSIS — M62830 Muscle spasm of back: Secondary | ICD-10-CM | POA: Diagnosis not present

## 2020-05-23 DIAGNOSIS — Z79891 Long term (current) use of opiate analgesic: Secondary | ICD-10-CM | POA: Diagnosis not present

## 2020-06-20 DIAGNOSIS — M62830 Muscle spasm of back: Secondary | ICD-10-CM | POA: Diagnosis not present

## 2020-06-20 DIAGNOSIS — Z79891 Long term (current) use of opiate analgesic: Secondary | ICD-10-CM | POA: Diagnosis not present

## 2020-06-20 DIAGNOSIS — G894 Chronic pain syndrome: Secondary | ICD-10-CM | POA: Diagnosis not present

## 2020-06-20 DIAGNOSIS — G620 Drug-induced polyneuropathy: Secondary | ICD-10-CM | POA: Diagnosis not present

## 2020-07-18 DIAGNOSIS — M62830 Muscle spasm of back: Secondary | ICD-10-CM | POA: Diagnosis not present

## 2020-07-18 DIAGNOSIS — G894 Chronic pain syndrome: Secondary | ICD-10-CM | POA: Diagnosis not present

## 2020-07-18 DIAGNOSIS — Z79891 Long term (current) use of opiate analgesic: Secondary | ICD-10-CM | POA: Diagnosis not present

## 2020-07-18 DIAGNOSIS — G620 Drug-induced polyneuropathy: Secondary | ICD-10-CM | POA: Diagnosis not present

## 2020-08-15 DIAGNOSIS — G620 Drug-induced polyneuropathy: Secondary | ICD-10-CM | POA: Diagnosis not present

## 2020-08-15 DIAGNOSIS — Z79891 Long term (current) use of opiate analgesic: Secondary | ICD-10-CM | POA: Diagnosis not present

## 2020-08-15 DIAGNOSIS — M62830 Muscle spasm of back: Secondary | ICD-10-CM | POA: Diagnosis not present

## 2020-08-15 DIAGNOSIS — G894 Chronic pain syndrome: Secondary | ICD-10-CM | POA: Diagnosis not present

## 2020-09-05 DIAGNOSIS — L853 Xerosis cutis: Secondary | ICD-10-CM | POA: Diagnosis not present

## 2020-09-05 DIAGNOSIS — D225 Melanocytic nevi of trunk: Secondary | ICD-10-CM | POA: Diagnosis not present

## 2020-09-05 DIAGNOSIS — L814 Other melanin hyperpigmentation: Secondary | ICD-10-CM | POA: Diagnosis not present

## 2020-09-05 DIAGNOSIS — L300 Nummular dermatitis: Secondary | ICD-10-CM | POA: Diagnosis not present

## 2020-09-05 DIAGNOSIS — B078 Other viral warts: Secondary | ICD-10-CM | POA: Diagnosis not present

## 2020-09-05 DIAGNOSIS — Z85828 Personal history of other malignant neoplasm of skin: Secondary | ICD-10-CM | POA: Diagnosis not present

## 2020-09-05 DIAGNOSIS — D1801 Hemangioma of skin and subcutaneous tissue: Secondary | ICD-10-CM | POA: Diagnosis not present

## 2020-09-05 DIAGNOSIS — L7211 Pilar cyst: Secondary | ICD-10-CM | POA: Diagnosis not present

## 2020-09-05 DIAGNOSIS — L821 Other seborrheic keratosis: Secondary | ICD-10-CM | POA: Diagnosis not present

## 2020-09-13 DIAGNOSIS — G620 Drug-induced polyneuropathy: Secondary | ICD-10-CM | POA: Diagnosis not present

## 2020-09-13 DIAGNOSIS — G894 Chronic pain syndrome: Secondary | ICD-10-CM | POA: Diagnosis not present

## 2020-09-13 DIAGNOSIS — Z79891 Long term (current) use of opiate analgesic: Secondary | ICD-10-CM | POA: Diagnosis not present

## 2020-09-13 DIAGNOSIS — M62830 Muscle spasm of back: Secondary | ICD-10-CM | POA: Diagnosis not present

## 2020-10-11 DIAGNOSIS — G620 Drug-induced polyneuropathy: Secondary | ICD-10-CM | POA: Diagnosis not present

## 2020-10-11 DIAGNOSIS — G894 Chronic pain syndrome: Secondary | ICD-10-CM | POA: Diagnosis not present

## 2020-10-11 DIAGNOSIS — M62830 Muscle spasm of back: Secondary | ICD-10-CM | POA: Diagnosis not present

## 2020-10-11 DIAGNOSIS — Z79891 Long term (current) use of opiate analgesic: Secondary | ICD-10-CM | POA: Diagnosis not present

## 2020-10-12 IMAGING — US ULTRASOUND RIGHT BREAST LIMITED
1 series · 5 of 5 positions shown · non-contrast
Comparison: Previous exam(s).

CLINICAL DATA: Patient presents with a small palpable nodule along
the medial margin of a right breast mastectomy scar. History of
bilateral mastectomies with abdominal trans flap reconstruction.
Treatment was for breast carcinoma.

EXAM:
ULTRASOUND OF THE RIGHT BREAST

[Series 1: ultrasound right breast limited · 0.05mm/px · 5 of 5 slices shown]
[im 1/5]
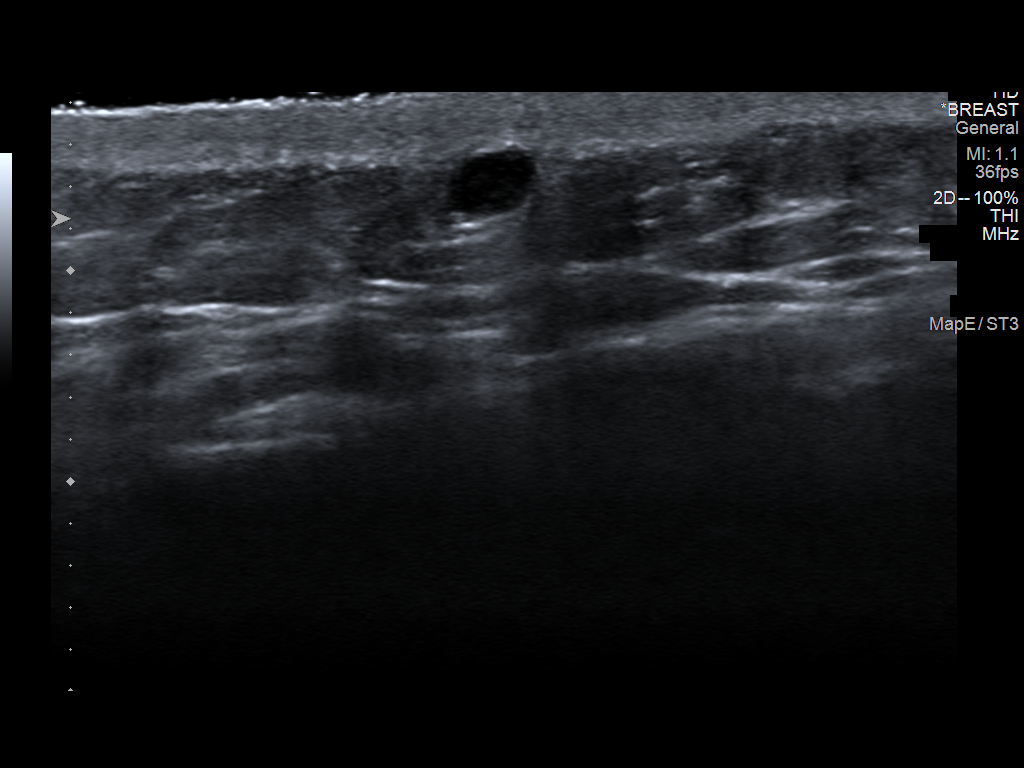
[im 2/5]
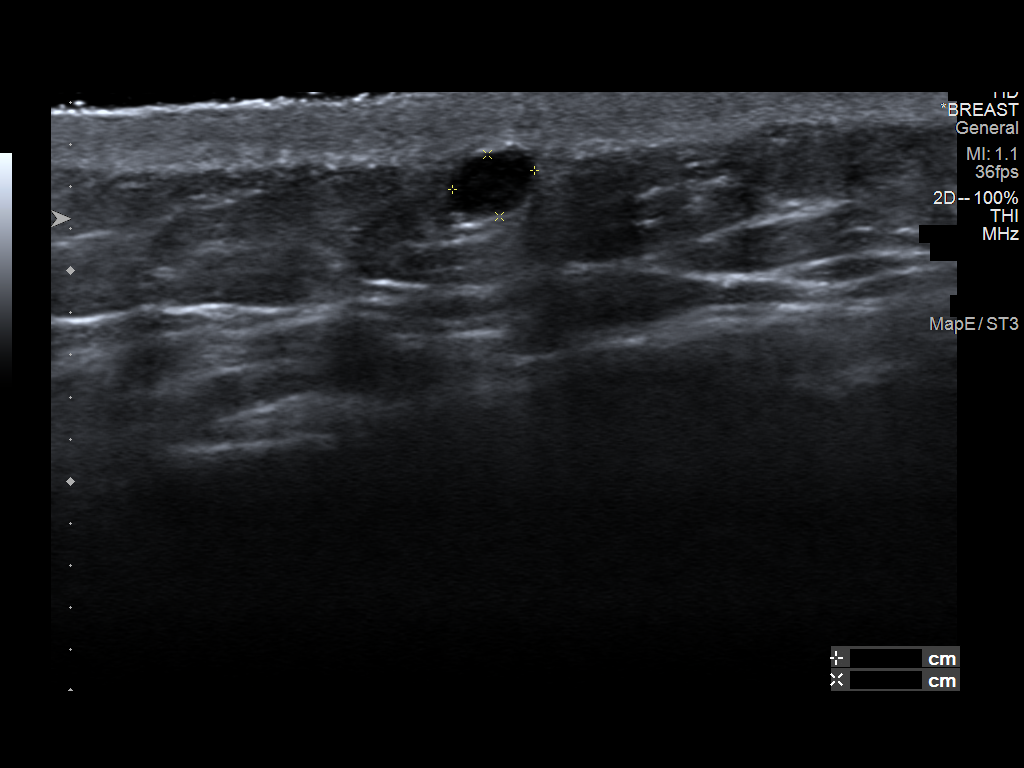
[im 3/5]
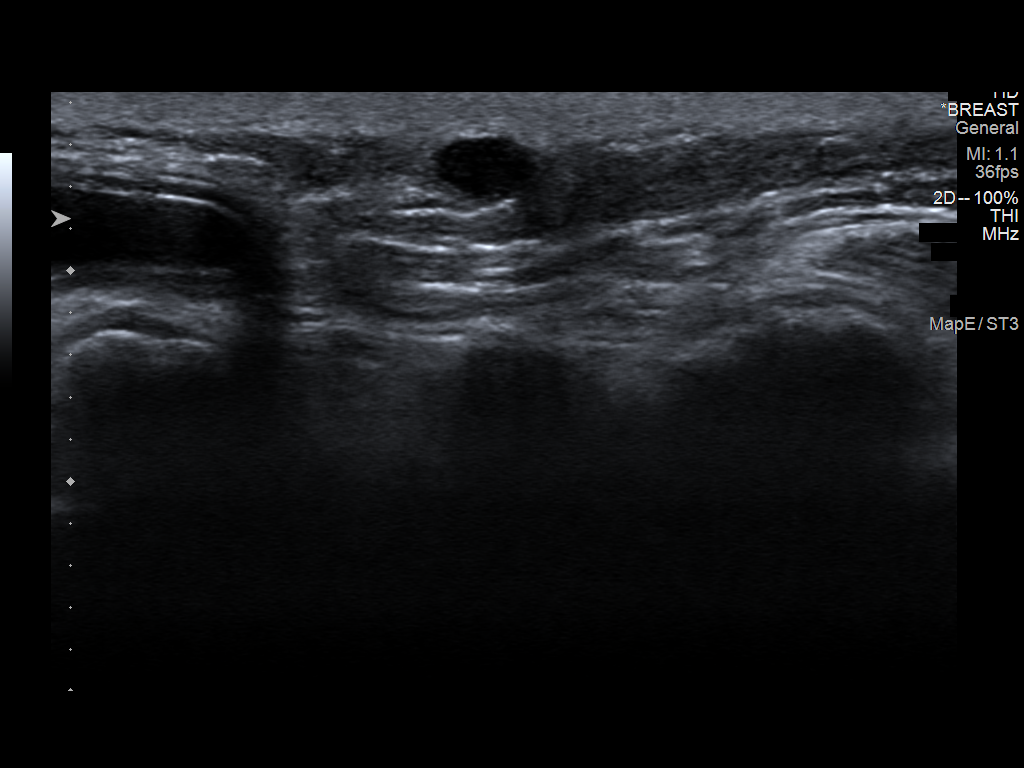
[im 4/5]
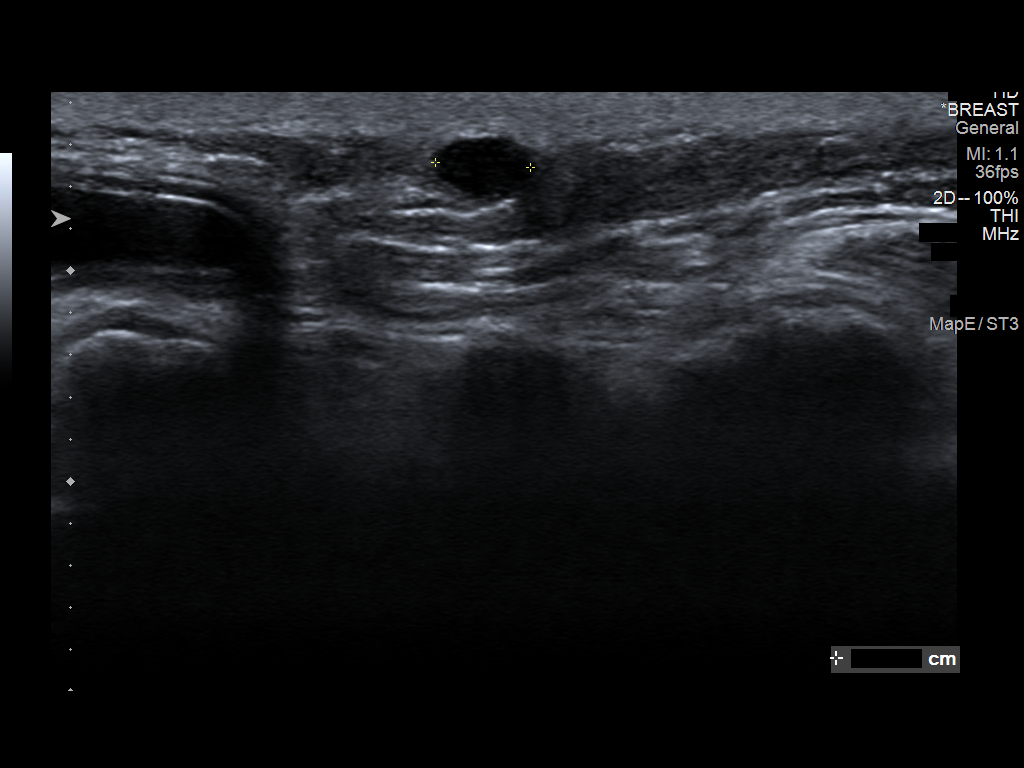
[im 5/5]
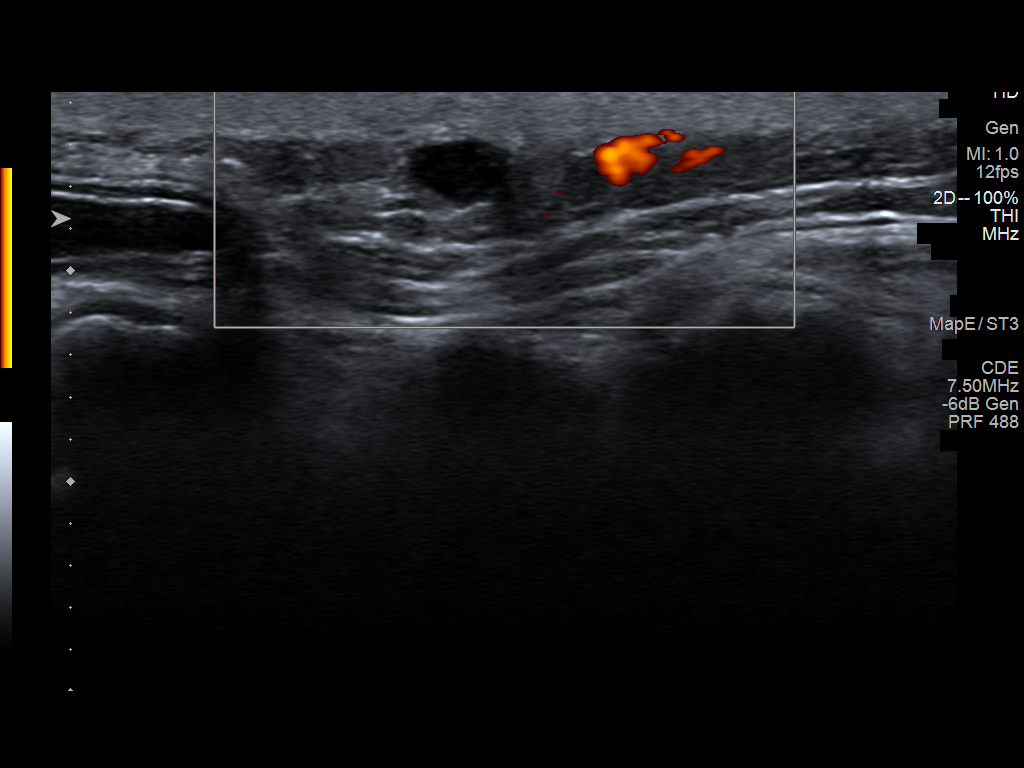

[5 of 5 positions shown; findings below may reference images not displayed]

FINDINGS: On physical exam, there is a subtle pinhead sized superficial nodule
along the medial margin of the right breast.

Targeted ultrasound is performed, showing an oval cyst, which abuts
the deep dermis, measuring 5 x 3 x 4 mm, corresponding to the
palpable abnormality. There are no solid masses or suspicious
lesions.
IMPRESSION: Small benign cyst, possibly an oil cyst, corresponds to the palpable
abnormality. No evidence of malignancy.

RECOMMENDATION:
1. Routine clinical follow-up.
2. If the medial mass appears to be enlarging, or if a new mass
develops, repeat diagnostic sonographic imaging would be indicated.

I have discussed the findings and recommendations with the patient.
Results were also provided in writing at the conclusion of the
visit. If applicable, a reminder letter will be sent to the patient
regarding the next appointment.

BI-RADS CATEGORY  2: Benign.

## 2020-11-06 DIAGNOSIS — G894 Chronic pain syndrome: Secondary | ICD-10-CM | POA: Diagnosis not present

## 2020-11-06 DIAGNOSIS — M62830 Muscle spasm of back: Secondary | ICD-10-CM | POA: Diagnosis not present

## 2020-11-06 DIAGNOSIS — G620 Drug-induced polyneuropathy: Secondary | ICD-10-CM | POA: Diagnosis not present

## 2020-11-06 DIAGNOSIS — Z79891 Long term (current) use of opiate analgesic: Secondary | ICD-10-CM | POA: Diagnosis not present

## 2020-12-06 DIAGNOSIS — Z79891 Long term (current) use of opiate analgesic: Secondary | ICD-10-CM | POA: Diagnosis not present

## 2020-12-06 DIAGNOSIS — G620 Drug-induced polyneuropathy: Secondary | ICD-10-CM | POA: Diagnosis not present

## 2020-12-06 DIAGNOSIS — M62830 Muscle spasm of back: Secondary | ICD-10-CM | POA: Diagnosis not present

## 2020-12-06 DIAGNOSIS — G894 Chronic pain syndrome: Secondary | ICD-10-CM | POA: Diagnosis not present

## 2020-12-11 DIAGNOSIS — Z720 Tobacco use: Secondary | ICD-10-CM | POA: Diagnosis not present

## 2020-12-11 DIAGNOSIS — E559 Vitamin D deficiency, unspecified: Secondary | ICD-10-CM | POA: Diagnosis not present

## 2020-12-11 DIAGNOSIS — Z853 Personal history of malignant neoplasm of breast: Secondary | ICD-10-CM | POA: Diagnosis not present

## 2020-12-11 DIAGNOSIS — Z79899 Other long term (current) drug therapy: Secondary | ICD-10-CM | POA: Diagnosis not present

## 2020-12-11 DIAGNOSIS — F17210 Nicotine dependence, cigarettes, uncomplicated: Secondary | ICD-10-CM | POA: Diagnosis not present

## 2020-12-11 DIAGNOSIS — I10 Essential (primary) hypertension: Secondary | ICD-10-CM | POA: Diagnosis not present

## 2020-12-11 DIAGNOSIS — E7800 Pure hypercholesterolemia, unspecified: Secondary | ICD-10-CM | POA: Diagnosis not present

## 2020-12-11 DIAGNOSIS — G629 Polyneuropathy, unspecified: Secondary | ICD-10-CM | POA: Diagnosis not present

## 2020-12-26 ENCOUNTER — Other Ambulatory Visit: Payer: Self-pay | Admitting: *Deleted

## 2020-12-26 DIAGNOSIS — Z87891 Personal history of nicotine dependence: Secondary | ICD-10-CM

## 2020-12-26 DIAGNOSIS — F17210 Nicotine dependence, cigarettes, uncomplicated: Secondary | ICD-10-CM

## 2021-01-03 DIAGNOSIS — G894 Chronic pain syndrome: Secondary | ICD-10-CM | POA: Diagnosis not present

## 2021-01-03 DIAGNOSIS — Z79891 Long term (current) use of opiate analgesic: Secondary | ICD-10-CM | POA: Diagnosis not present

## 2021-01-03 DIAGNOSIS — M62830 Muscle spasm of back: Secondary | ICD-10-CM | POA: Diagnosis not present

## 2021-01-03 DIAGNOSIS — G620 Drug-induced polyneuropathy: Secondary | ICD-10-CM | POA: Diagnosis not present

## 2021-01-31 DIAGNOSIS — G894 Chronic pain syndrome: Secondary | ICD-10-CM | POA: Diagnosis not present

## 2021-01-31 DIAGNOSIS — M62830 Muscle spasm of back: Secondary | ICD-10-CM | POA: Diagnosis not present

## 2021-01-31 DIAGNOSIS — Z79891 Long term (current) use of opiate analgesic: Secondary | ICD-10-CM | POA: Diagnosis not present

## 2021-01-31 DIAGNOSIS — G620 Drug-induced polyneuropathy: Secondary | ICD-10-CM | POA: Diagnosis not present

## 2021-02-11 ENCOUNTER — Ambulatory Visit
Admission: RE | Admit: 2021-02-11 | Discharge: 2021-02-11 | Disposition: A | Discharge status: Home / Self Care | Payer: Medicare Other | Source: Ambulatory Visit | Attending: Acute Care | Admitting: Acute Care

## 2021-02-11 ENCOUNTER — Ambulatory Visit (INDEPENDENT_AMBULATORY_CARE_PROVIDER_SITE_OTHER): Payer: Medicare Other | Admitting: Acute Care

## 2021-02-11 ENCOUNTER — Other Ambulatory Visit: Payer: Self-pay

## 2021-02-11 ENCOUNTER — Encounter: Payer: Self-pay | Admitting: Acute Care

## 2021-02-11 VITALS — BP 112/82 | HR 81 | Temp 98.2°F | Ht 64.5 in | Wt 115.8 lb

## 2021-02-11 DIAGNOSIS — Z87891 Personal history of nicotine dependence: Secondary | ICD-10-CM

## 2021-02-11 DIAGNOSIS — F17210 Nicotine dependence, cigarettes, uncomplicated: Secondary | ICD-10-CM

## 2021-02-11 NOTE — Progress Notes (Signed)
Shared Decision Making Visit Lung Cancer Screening Program [phone])   Eligibility:  Age 65 y.o.  Pack Years Smoking History Calculation 36 pack year smoking hx (# packs/per year x # years smoked)  Recent History of coughing up blood  no  Unexplained weight loss? no ( >Than 15 pounds within the last 6 months )  Prior History Lung / other cancer no (Diagnosis within the last 5 years already requiring surveillance chest CT Scans).  Smoking Status Current Smoker  Former Smokers: Years since quit: NA  Quit Date: NA  Visit Components:  Discussion included one or more decision making aids. yes  Discussion included risk/benefits of screening. yes  Discussion included potential follow up diagnostic testing for abnormal scans. yes  Discussion included meaning and risk of over diagnosis. yes  Discussion included meaning and risk of False Positives. yes  Discussion included meaning of total radiation exposure. yes  Counseling Included:  Importance of adherence to annual lung cancer LDCT screening. yes  Impact of comorbidities on ability to participate in the program. yes  Ability and willingness to under diagnostic treatment. yes  Smoking Cessation Counseling:  Current Smokers:   Discussed importance of smoking cessation. yes  Information about tobacco cessation classes and interventions provided to patient. yes  Patient provided with "ticket" for LDCT Scan. yes  Symptomatic Patient. no  Counseling  Diagnosis Code: Tobacco Use Z72.0  Asymptomatic Patient yes  Counseling (Intermediate counseling: > three minutes counseling) T0626  Former Smokers:   Discussed the importance of maintaining cigarette abstinence. yes  Diagnosis Code: Personal History of Nicotine Dependence. R48.546  Information about tobacco cessation classes and interventions provided to patient. Yes  Patient provided with "ticket" for LDCT Scan. yes  Written Order for Lung Cancer Screening  with LDCT placed in Epic. Yes (CT Chest Lung Cancer Screening Low Dose W/O CM) [id number] Z12.2-Screening of respiratory organs Z87.891-Personal history of nicotine dependence  I have spent 25 minutes of face to face time with Tamara Webb discussing the risks and benefits of lung cancer screening. We viewed a power point together that explained in detail the above noted topics. We paused at intervals to allow for questions to be asked and answered to ensure understanding.We discussed that the single most powerful action that she can take to decrease her risk of developing lung cancer is to quit smoking. We discussed whether or not she is ready to commit to setting a quit date. We discussed options for tools to aid in quitting smoking including nicotine replacement therapy, non-nicotine medications, support groups, Quit Smart classes, and behavior modification. We discussed that often times setting smaller, more achievable goals, such as eliminating 1 cigarette a day for a week and then 2 cigarettes a day for a week can be helpful in slowly decreasing the number of cigarettes smoked. This allows for a sense of accomplishment as well as providing a clinical benefit. I gave her the " Be Stronger Than Your Excuses" card with contact information for community resources, classes, free nicotine replacement therapy, and access to mobile apps, text messaging, and on-line smoking cessation help. I have also given her my card and contact information in the event she needs to contact me. We discussed the time and location of the scan, and that either Tamara Glassman RN or I will call with the results within 24-48 hours of receiving them. I have offered her  a copy of the power point we viewed  as a resource in the event they need reinforcement of  the concepts we discussed today in the office. The patient verbalized understanding of all of  the above and had no further questions upon leaving the office. They have my contact  information in the event they have any further questions.  I spent 4 minutes counseling on smoking cessation and the health risks of continued tobacco abuse.  I explained to the patient that there has been a high incidence of coronary artery disease noted on these exams. I explained that this is a non-gated exam therefore degree or severity cannot be determined. This patient is currently on statin therapy. I have asked the patient to follow-up with their PCP regarding any incidental finding of coronary artery disease and management with diet or medication as their PCP  feels is clinically indicated. The patient verbalized understanding of the above and had no further questions upon completion of the visit.    Magdalen Spatz, NP 02/11/2021

## 2021-02-11 NOTE — Patient Instructions (Signed)

## 2021-02-18 NOTE — Progress Notes (Signed)
I also explained that she has age advanced CAD. She is not currently taking the statin medication her PCP has ordered due to cost. I have asked her to follow up with her PCP regarding statin therapy, and also suggested she look into financial assistance for statin medication through her PCP. She verbalized understanding.   Langley Gauss, please fax results to PCP and order 6 month follow up. Thanks so much.

## 2021-02-18 NOTE — Progress Notes (Signed)
I have called the patient with the results of her low dose CT Chest.I explained that her scan was read as a Lung  RADS 3, nodules that are probably benign findings, short term follow up suggested: includes nodules with a low likelihood of becoming a clinically active cancer. Radiology recommends a 6 month repeat LDCT follow up. I explained that we will order a follow up scan for 08/2021 to evaluate the area of concern for stability, resolution or growth . She verbalized understanding and had no further questions at completion of the scan. She did ask for a copy of the scan results. These have been mailed today.

## 2021-02-19 ENCOUNTER — Other Ambulatory Visit: Payer: Self-pay | Admitting: *Deleted

## 2021-02-19 DIAGNOSIS — F17210 Nicotine dependence, cigarettes, uncomplicated: Secondary | ICD-10-CM

## 2021-02-19 DIAGNOSIS — Z87891 Personal history of nicotine dependence: Secondary | ICD-10-CM

## 2021-02-25 DIAGNOSIS — G620 Drug-induced polyneuropathy: Secondary | ICD-10-CM | POA: Diagnosis not present

## 2021-02-25 DIAGNOSIS — Z79891 Long term (current) use of opiate analgesic: Secondary | ICD-10-CM | POA: Diagnosis not present

## 2021-02-25 DIAGNOSIS — M62830 Muscle spasm of back: Secondary | ICD-10-CM | POA: Diagnosis not present

## 2021-02-25 DIAGNOSIS — G894 Chronic pain syndrome: Secondary | ICD-10-CM | POA: Diagnosis not present

## 2021-03-07 ENCOUNTER — Other Ambulatory Visit: Payer: Self-pay

## 2021-03-07 ENCOUNTER — Encounter: Payer: Self-pay | Admitting: Adult Health

## 2021-03-07 ENCOUNTER — Inpatient Hospital Stay: Payer: Medicare Other | Attending: Adult Health | Admitting: Adult Health

## 2021-03-07 VITALS — BP 144/74 | HR 73 | Temp 98.1°F | Resp 17 | Wt 113.6 lb

## 2021-03-07 DIAGNOSIS — F17210 Nicotine dependence, cigarettes, uncomplicated: Secondary | ICD-10-CM | POA: Insufficient documentation

## 2021-03-07 DIAGNOSIS — T451X5A Adverse effect of antineoplastic and immunosuppressive drugs, initial encounter: Secondary | ICD-10-CM | POA: Diagnosis not present

## 2021-03-07 DIAGNOSIS — Z9221 Personal history of antineoplastic chemotherapy: Secondary | ICD-10-CM | POA: Insufficient documentation

## 2021-03-07 DIAGNOSIS — Z90722 Acquired absence of ovaries, bilateral: Secondary | ICD-10-CM | POA: Diagnosis not present

## 2021-03-07 DIAGNOSIS — Z79899 Other long term (current) drug therapy: Secondary | ICD-10-CM | POA: Insufficient documentation

## 2021-03-07 DIAGNOSIS — Z90710 Acquired absence of both cervix and uterus: Secondary | ICD-10-CM | POA: Insufficient documentation

## 2021-03-07 DIAGNOSIS — M8580 Other specified disorders of bone density and structure, unspecified site: Secondary | ICD-10-CM | POA: Diagnosis not present

## 2021-03-07 DIAGNOSIS — Z923 Personal history of irradiation: Secondary | ICD-10-CM | POA: Diagnosis not present

## 2021-03-07 DIAGNOSIS — Z9013 Acquired absence of bilateral breasts and nipples: Secondary | ICD-10-CM | POA: Insufficient documentation

## 2021-03-07 DIAGNOSIS — G8929 Other chronic pain: Secondary | ICD-10-CM | POA: Diagnosis not present

## 2021-03-07 DIAGNOSIS — C50912 Malignant neoplasm of unspecified site of left female breast: Secondary | ICD-10-CM

## 2021-03-07 DIAGNOSIS — J449 Chronic obstructive pulmonary disease, unspecified: Secondary | ICD-10-CM | POA: Diagnosis not present

## 2021-03-07 DIAGNOSIS — G620 Drug-induced polyneuropathy: Secondary | ICD-10-CM | POA: Insufficient documentation

## 2021-03-07 DIAGNOSIS — Z853 Personal history of malignant neoplasm of breast: Secondary | ICD-10-CM | POA: Insufficient documentation

## 2021-03-07 DIAGNOSIS — Z803 Family history of malignant neoplasm of breast: Secondary | ICD-10-CM | POA: Insufficient documentation

## 2021-03-07 NOTE — Progress Notes (Signed)
CLINIC:  Survivorship   REASON FOR VISIT:  Routine follow-up for history of breast cancer.   BRIEF ONCOLOGIC HISTORY:  Per Dr Jana Hakim last note from 04/16/2015:   Tamara Webb is a  65 y.o.  [place], [place] woman diagnosed with left-sided breast cancer in 1998   1.  She was treated with 4 cycles of neoadjuvant chemotherapy consisting of AC (Adriamycin/Cytoxan) completed in 10/1997.   2. She is status post left breast lumpectomy in 06/1998 for aT2 N0,  stage IIA  invasive carcinoma,  ER positive,  PR negative.   3. She is status post left breast radiation therapy, completed in 10/1998.    4. Radiation therapy was followed by 5 years of adjuvant hormonal therapy with various antiestrogen agents.     5.She underwent total bilateral hysterectomy/bilateral salpingo-oophorectomy in 08/2001.     6. Recurrent disease in the left breast diagnosed by core biopsy 11/12/2004 which showed invasive mammary carcinoma.   7.  Status post left breast simple mastectomy and right breast simple mastectomy with left axillary sentinel node biopsy on 01/06/2005 for a left breast, pT1c, pN0(i-)(sn), stage I invasive ductal carcinoma with lobular features, grade 2,  estrogen receptor negative, progesterone receptor negative, Ki-67 6%, HER-2/neu by FISH no amplification (right breast showed no evidence of malignancy)    8.  The patient began adjuvant chemotherapy with TC (Taxotere/Cytoxan) on 02/20/2005, completing 4 cycles    9.  Status post bilateral breast reconstructive surgery with TRAM flaps in 06/2005.   10.  Chronic pain   (followed by Dr Nicholaus Bloom.)   11.  Tobacco abuse (patient was again counseled on smoking cessation)   12.  Osteopenia,    INTERVAL HISTORY:  Tamara Webb presents to the [facility] today for routine follow-up for her history of breast cancer.  Overall, she reports feeling quite well.    Tamara Webb continues to smoke and she is down to less than 1 pack per day and is  doing ok.  She is not ready to quit now, but wants to in the future.  She did undergo st lung cancer screening that noted a small likely benign nodule and f/u in 6 months was recommended.   She hasn't undergone colon cancer screening.  She says that she is planning on going ot see her PCP.  She is active throughout the day.  I asked her if she had any hobbies, if she is working, and what she typically will do throughout the day, and she said that she did not want to talk about it because she is a private person.     REVIEW OF SYSTEMS:  Review of Systems  Constitutional:  Negative for appetite change, chills, fatigue, fever and unexpected weight change.  HENT:   Negative for hearing loss, lump/mass and sore throat.   Eyes:  Negative for eye problems and icterus.  Respiratory:  Negative for chest tightness, cough and shortness of breath.   Cardiovascular:  Negative for chest pain, leg swelling and palpitations.  Gastrointestinal:  Negative for abdominal distention, abdominal pain, blood in stool, constipation, diarrhea, nausea and vomiting.  Endocrine: Negative for hot flashes.  Skin:  Negative for itching and rash.  Neurological:  Negative for dizziness, extremity weakness, headaches and numbness.  Hematological:  Negative for adenopathy. Does not bruise/bleed easily.  Psychiatric/Behavioral:  Negative for depression. The patient is not nervous/anxious.      PAST MEDICAL/SURGICAL HISTORY:  Past Medical History:  Diagnosis Date   Allergy  Breast cancer ([place])    bilateral    Cancer [facility]) 1999&2006   breast cancer x2    COPD (chronic obstructive pulmonary disease) (HCC)    Family history of breast cancer    Heart murmur 1999   Neuropathy    Neuropathy due to chemotherapeutic drug [facility])    breast cancer , Dr Truddie Coco.    Past Surgical History:  Procedure Laterality Date   APPENDECTOMY     BREAST RECONSTRUCTION     CARPAL TUNNEL RELEASE     right   ECTOPIC PREGNANCY SURGERY      LYMPH GLAND EXCISION     left arm   MASTECTOMY  2006   bil.mastectomy with abdominal trans flap.   OVARY SURGERY     after ectopic sx   TONSILLECTOMY  as child   TOTAL ABDOMINAL HYSTERECTOMY  2001     ALLERGIES:  Allergies  Allergen Reactions   Gadolinium Derivatives Shortness Of Breath and Swelling    Given for xray   Penicillins Shortness Of Breath   Ivp Dye [Iodinated Diagnostic Agents]    Pepto-Bismol [Bismuth Subsalicylate] Swelling     CURRENT MEDICATIONS:  Outpatient Encounter Medications as of 03/07/2021  Medication Sig   atorvastatin (LIPITOR) 10 MG tablet 1 tablet   Cholecalciferol (VITAMIN D PO) Take 5,000 Units by mouth daily.   gabapentin (NEURONTIN) 600 MG tablet Take 1 tablet (600 mg total) by mouth 3 (three) times daily. (Patient taking differently: Take 600 mg by mouth QID.)   morphine (KADIAN) 20 MG 24 hr capsule Take 20 mg by mouth daily. 3 tabs daily   Multiple Vitamin (MULTIVITAMIN) tablet Take 1 tablet by mouth daily.   Oxycodone HCl 20 MG TABS 10 mg. 2-3 daily   No facility-administered encounter medications on file as of 03/07/2021.     ONCOLOGIC FAMILY HISTORY:  Family History  Problem Relation Age of Onset   Colon polyps Sister 22   Diabetes Sister    Diabetes Mother    Multiple sclerosis Mother    Heart attack Mother    Stroke Mother    Diabetes Father    Heart attack Father    Cancer Father        Kidney cancer   Breast cancer Paternal Aunt        dx in her 83s   Dementia Maternal Grandmother    Heart attack Maternal Grandfather    Pulmonary embolism Paternal Grandmother    Cancer Paternal Grandfather        unknown type   Breast cancer Cousin        paternal first cousin dx under 11   Colon cancer Neg Hx     GENETIC COUNSELING/TESTING: 2018  SOCIAL HISTORY:  Social History   Socioeconomic History   Marital status: Married    Spouse name: Doctor, general practice    Number of children: 0   Years of education: 12+   Highest education level:  Not on file  Occupational History    Employer: UNEMPLOYED  Tobacco Use   Smoking status: Every Day    Packs/day: 1.00    Years: 36.00    Pack years: 36.00    Types: Cigarettes   Smokeless tobacco: Never   Tobacco comments:    quit for 20 years,just started back in 2000  Substance and Sexual Activity   Alcohol use: No    Alcohol/week: 0.0 standard drinks   Drug use: No   Sexual activity: Yes    Birth control/protection: Surgical  Other  Topics Concern   Not on file  Social History Narrative   Patient lives with [name] her husband.    Patient has no children.    Patient is currently not working.    Patient has some college.    Social Determinants of Health   Financial Resource Strain: Not on file  Food Insecurity: Not on file  Transportation Needs: Not on file  Physical Activity: Not on file  Stress: Not on file  Social Connections: Not on file  Intimate Partner Violence: Not on file     PHYSICAL EXAMINATION:  Vital Signs: Vitals:   03/07/21 1443  BP: (!) 144/74  Pulse: 73  Resp: 17  Temp: 98.1 F (36.7 C)  SpO2: 100%   Filed Weights   03/07/21 1443  Weight: 113 lb 9.6 oz (51.5 kg)   General: Well-nourished, well-appearing female in no acute distress.  Unaccompanied today.   HEENT: Head is normocephalic.  Pupils equal and reactive to light. Conjunctivae clear without exudate.  Sclerae anicteric. Oral mucosa is pink, moist.  Oropharynx is pink without lesions or erythema.  Lymph: No cervical, supraclavicular, or infraclavicular lymphadenopathy noted on palpation.  Cardiovascular: Regular rate and rhythm.Marland Kitchen Respiratory: Clear to auscultation bilaterally. Chest expansion symmetric; breathing non-labored.  Breast Exam:  Breasts s/p bilateral mastectomies with no sign of local recurrence.    -Axilla: No axillary adenopathy bilaterally.  GI: Abdomen soft and round; non-tender, non-distended. Bowel sounds normoactive. No hepatosplenomegaly.   GU: Deferred.  Neuro: No  focal deficits. Steady gait.  Psych: Mood and affect normal and appropriate for situation.  MSK: No focal spinal tenderness to palpation, full range of motion in bilateral upper extremities Extremities: No edema. Skin: Warm and dry.  LABORATORY DATA:  None for this visit   DIAGNOSTIC IMAGING:  Most recent mammogram: n/a, s/p bilateral mastectomies    ASSESSMENT AND PLAN:  Tamara Webb is a pleasant 65 y.o. female with history of recurrent breast cancer, most recently in 2006.  [facility] for surveillance and routine follow-up.   1. History of breast cancer:  Tamara Webb is currently clinically and radiographically without evidence of disease or recurrence of breast cancer.  She would like to continue to return here annually for f/u and surveillance which we are happy to accommodate.  I encouraged her to call me with any questions or concerns before her next visit at the cancer center, and I would be happy to see her sooner, if needed.    2. Tobacco use: She is not yet ready to quit smoking.  She is undergoing annual CT scan lung cancer screening with our lung cancer screening navigation program.    3. Cancer screening:  Due to Tamara Webb's history and her age, she should receive screening for skin cancers, colon cancer. She was encouraged to follow-up with her PCP for appropriate cancer screenings.   4. Health maintenance and wellness promotion: Tamara Webb was encouraged to consume 5-7 servings of fruits and vegetables per day. She was also encouraged to engage in moderate to vigorous exercise for 30 minutes per day most days of the week. She was instructed to limit her alcohol consumption and was encouraged to quit smoking.      Dispo:  -Return to cancer center in one year for LTS follow up  Total encounter time: 20 minutes*    Gardenia Phlegm, NP [place] [phone]  *Total Encounter Time as defined by  the Centers for Medicare  and Medicaid Services includes, in addition to the face-to-face time of a patient visit (documented in the note above) non-face-to-face time: obtaining and reviewing outside history, ordering and reviewing medications, tests or procedures, care coordination (communications with other health care professionals or caregivers) and documentation in the medical record.   Note: PRIMARY CARE PROVIDER Janifer Adie, [place] [phone]

## 2021-03-08 ENCOUNTER — Telehealth: Payer: Self-pay | Admitting: Adult Health

## 2021-03-08 NOTE — Telephone Encounter (Signed)
Scheduled appointment per 06/30 los. Patient is aware.

## 2021-03-28 DIAGNOSIS — G620 Drug-induced polyneuropathy: Secondary | ICD-10-CM | POA: Diagnosis not present

## 2021-03-28 DIAGNOSIS — G894 Chronic pain syndrome: Secondary | ICD-10-CM | POA: Diagnosis not present

## 2021-03-28 DIAGNOSIS — M62830 Muscle spasm of back: Secondary | ICD-10-CM | POA: Diagnosis not present

## 2021-03-28 DIAGNOSIS — Z79891 Long term (current) use of opiate analgesic: Secondary | ICD-10-CM | POA: Diagnosis not present

## 2021-05-01 DIAGNOSIS — M4125 Other idiopathic scoliosis, thoracolumbar region: Secondary | ICD-10-CM | POA: Diagnosis not present

## 2021-05-01 DIAGNOSIS — G620 Drug-induced polyneuropathy: Secondary | ICD-10-CM | POA: Diagnosis not present

## 2021-05-01 DIAGNOSIS — G894 Chronic pain syndrome: Secondary | ICD-10-CM | POA: Diagnosis not present

## 2021-05-01 DIAGNOSIS — Z79891 Long term (current) use of opiate analgesic: Secondary | ICD-10-CM | POA: Diagnosis not present

## 2021-05-11 DIAGNOSIS — U071 COVID-19: Secondary | ICD-10-CM | POA: Diagnosis not present

## 2021-06-05 DIAGNOSIS — G894 Chronic pain syndrome: Secondary | ICD-10-CM | POA: Diagnosis not present

## 2021-06-05 DIAGNOSIS — M4125 Other idiopathic scoliosis, thoracolumbar region: Secondary | ICD-10-CM | POA: Diagnosis not present

## 2021-06-05 DIAGNOSIS — Z79891 Long term (current) use of opiate analgesic: Secondary | ICD-10-CM | POA: Diagnosis not present

## 2021-06-05 DIAGNOSIS — G620 Drug-induced polyneuropathy: Secondary | ICD-10-CM | POA: Diagnosis not present

## 2021-06-06 NOTE — Progress Notes (Signed)
Cardiology Office Note:    Date:  06/07/2021  NAME:  HALIMAH BEWICK    MRN: [id number] DOB:  Nov 17, 1955   PCP:  Janifer Adie, MD  Cardiologist:  None  Electrophysiologist:  None   Referring MD: Lujean Amel, MD   Chief Complaint  Patient presents with   Coronary Artery Disease         History of Present Illness:    NATALIAH HATLESTAD is a 65 y.o. female with a hx of breast CA, tobacco abuse who is being seen today for the evaluation of CAD at the request of Janifer Adie, MD. she recently underwent lung cancer screening.  Found to have minimal to mild coronary calcifications in the LAD and left circumflex distribution.  I did review the CT scan.  She does have an extensive history of tobacco abuse.  Nearly 30 to 40 pack years.  She is still smoking.  Smoking cessation was advised in office today.  Medical history also significant for breast cancer that is in remission.  She also has very high cholesterol.  Most recent LDL cholesterol 224.  Suspect this is familial hypercholesterolemia.  Her sister has high cholesterol as well as her father.  Apparently her father had heart disease at age 70.  She reports that she does suffer from neuropathy from her chemotherapy.  She has pain all over her body.  She reports pain in her legs as well as tightness in her chest.  She reports she cannot get tightness in the chest that is worse with activity.  It is alleviated by rest.  She reports her pain medicine does help at some time.  She has never had a heart attack or stroke.  Again her pain is difficult to delineate.  It is reassuring that her EKG is very normal.  She is still smoking and has done so for 30 to 40 years.  She apparently quit for several years but started back when she was diagnosed with cancer.  She is not diabetic.  She is disabled.  She is married but has no children.  No history of hypertension.  BP 142/78 in office today.  Very strong family history of heart  disease.  Problem List Breast CA -S/p chemo/Rx/lumpectomy 1999 -recurrence 2006 2. Tobacco abuse  3. Coronary calcifications  -lung CA screening CT 02/12/2021 4. HLD -Total cholesterol 299, HDL 39, LDL 224, triglycerides 184  Past Medical History: Past Medical History:  Diagnosis Date   Allergy    Breast cancer ([place])    bilateral    Cancer ([place]) 1999&2006   breast cancer x2    COPD (chronic obstructive pulmonary disease) ([place])    Family history of breast cancer    Heart murmur 1999   Hyperlipidemia    Neuropathy    Neuropathy due to chemotherapeutic drug ([place])    breast cancer , Dr Truddie Coco.     Past Surgical History: Past Surgical History:  Procedure Laterality Date   APPENDECTOMY     BREAST RECONSTRUCTION     CARPAL TUNNEL RELEASE     right   ECTOPIC PREGNANCY SURGERY     LYMPH GLAND EXCISION     left arm   MASTECTOMY  09/08/2004   bil.mastectomy with abdominal trans flap.   OVARY SURGERY     after ectopic sx   TONSILLECTOMY  as child   TOTAL ABDOMINAL HYSTERECTOMY  09/09/1999    Current Medications: Current Meds  Medication Sig   atorvastatin (LIPITOR) 10  MG tablet 1 tablet   Cholecalciferol (VITAMIN D PO) Take 5,000 Units by mouth daily.   gabapentin (NEURONTIN) 600 MG tablet Take 1 tablet (600 mg total) by mouth 3 (three) times daily. (Patient taking differently: Take 600 mg by mouth QID.)   MS CONTIN 60 MG 12 hr tablet SMARTSIG:1 Tablet(s) By Mouth Every 12 Hours   Multiple Vitamin (MULTIVITAMIN) tablet Take 1 tablet by mouth daily.   oxyCODONE (OXY IR/ROXICODONE) 5 MG immediate release tablet Take 5 mg by mouth 3 (three) times daily as needed.   [DISCONTINUED] metoprolol tartrate (LOPRESSOR) 50 MG tablet Take 1 tablet by mouth once for procedure.     Allergies:    Gadolinium derivatives, Penicillins, Ivp dye [iodinated diagnostic agents], and Pepto-bismol [bismuth subsalicylate]   Social History: Social History   Socioeconomic History   Marital  status: Married    Spouse name: Doctor, general practice    Number of children: 0   Years of education: 12+   Highest education level: Not on file  Occupational History    Employer: UNEMPLOYED   Occupation: Disabled  Tobacco Use   Smoking status: Every Day    Packs/day: 1.00    Years: 36.00    Pack years: 36.00    Types: Cigarettes   Smokeless tobacco: Never   Tobacco comments:    quit for 20 years,just started back in 2000  Substance and Sexual Activity   Alcohol use: No    Alcohol/week: 0.0 standard drinks   Drug use: No   Sexual activity: Yes    Birth control/protection: Surgical  Other Topics Concern   Not on file  Social History Narrative   Patient lives with [name] her husband.    Patient has no children.    Patient is currently not working.    Patient has some college.    Social Determinants of Health   Financial Resource Strain: Not on file  Food Insecurity: Not on file  Transportation Needs: Not on file  Physical Activity: Not on file  Stress: Not on file  Social Connections: Not on file     Family History: The patient's family history includes Breast cancer in her cousin and paternal aunt; Cancer in her father and paternal grandfather; Colon polyps (age of onset: 42) in her sister; Dementia in her maternal grandmother; Diabetes in her father, mother, and sister; Heart attack in her father, maternal grandfather, and mother; Multiple sclerosis in her mother; Pulmonary embolism in her paternal grandmother; Stroke in her mother. There is no history of Colon cancer.  ROS:   All other ROS reviewed and negative. Pertinent positives noted in the HPI.    EKGs/Labs/Other Studies Reviewed:   The following studies were personally reviewed by me today:  EKG:  EKG is ordered today.  The ekg ordered today demonstrates normal sinus rhythm heart rate 68, no acute ischemic changes, no evidence of infarction, and was personally reviewed by me.   Recent Labs: No results found for requested labs  within last 8760 hours.   Recent Lipid Panel No results found for: CHOL, TRIG, HDL, CHOLHDL, VLDL, LDLCALC, LDLDIRECT  Physical Exam:   VS:  BP (!) 142/78 (BP Location: Right Arm, Patient Position: Sitting, Cuff Size: Normal)   Pulse 68   Ht 5' 4.5" (1.638 m)   Wt 112 lb 11.2 oz (51.1 kg)   BMI 19.05 kg/m    Wt Readings from Last 3 Encounters:  06/07/21 112 lb 11.2 oz (51.1 kg)  03/07/21 113 lb 9.6 oz (51.5 kg)  02/11/21 115 lb 12.8 oz (52.5 kg)    General: Well nourished, well developed, in no acute distress Head: Atraumatic, normal size  Eyes: PEERLA, EOMI  Neck: Supple, no JVD Endocrine: No thryomegaly Cardiac: Normal S1, S2; RRR; no murmurs, rubs, or gallops Lungs: Clear to auscultation bilaterally, no wheezing, rhonchi or rales  Abd: Soft, nontender, no hepatomegaly  Ext: No edema, pulses 2+ Musculoskeletal: No deformities, BUE and BLE strength normal and equal Skin: Warm and dry, no rashes   Neuro: Alert and oriented to person, place, time, and situation, CNII-XII grossly intact, no focal deficits  Psych: Normal mood and affect   ASSESSMENT:   MALAYIA SPIZZIRRI is a 65 y.o. female who presents for the following: 1. Precordial pain   2. Chest pain of uncertain etiology   3. Coronary artery disease involving native coronary artery of native heart without angina pectoris   4. Coronary artery calcification seen on computed tomography   5. Mixed hyperlipidemia   6. Tobacco abuse     PLAN:   1. Precordial pain 2. Chest pain of uncertain etiology 3. Coronary artery disease involving native coronary artery of native heart without angina pectoris 4. Coronary artery calcification seen on computed tomography -She has evidence of mild coronary calcifications on recent noncontrast and on gated chest CT.  She suffers from chronic pain from prior chemotherapy use.  She reports intermittent chest tightness that can be worsened by exertion and alleviated by rest.  Her symptoms are  very alarming given her exceedingly high LDL cholesterol.  I highly suspect she has familial hyperlipidemia.  She reports that her father had heart trouble starting at age 58.  I do agree with Lipitor 10 mg daily started by her primary care physician.  She may need more.  She has been on this for several months.  Given her chest pain symptoms which are difficult to delineate I recommended coronary CTA for further evaluation.  She will take 100 mg of metoprolol tartrate 2 hours before the scan.  The CT will give [place] a better idea if she has any obstructive CAD as well as quantify her calcium score.  Regardless she needs her LDL cholesterol less than 70.  We will see if we can get their on Lipitor.  When she comes back next week for BMP we will check a fasting lipid profile.  Would like to see how much lower her cholesterol is on 10 mg of Lipitor.  She has no children but we likely will discuss genetic testing.  She meets criteria for familial hypercholesterolemia clinically alone. -Blood pressure seems acceptable today.  Smoking cessation was advised.  Regular exercise was also recommended.  5. Mixed hyperlipidemia -Suspect this is familial hyperlipidemia.  Continue Lipitor 10 mg daily.  She will come back next week for fasting lipid profile.  Goal LDL less than 70 given coronary calcifications.  6. Tobacco abuse -4 minutes of smoking cessation counseling was provided.  She really needs to quit smoking.  This is contributing.  Disposition: Return in about 3 months (around 09/06/2021).  Medication Adjustments/Labs and Tests Ordered: Current medicines are reviewed at length with the patient today.  Concerns regarding medicines are outlined above.  Orders Placed This Encounter  Procedures   CT CORONARY MORPH W/CTA COR W/SCORE W/CA W/CM &/OR WO/CM   Basic metabolic panel   Lipid panel   EKG 12-Lead   Meds ordered this encounter  Medications   DISCONTD: metoprolol tartrate (LOPRESSOR) 50 MG tablet  Sig: Take 1 tablet by mouth once for procedure.    Dispense:  1 tablet    Refill:  0   metoprolol tartrate (LOPRESSOR) 100 MG tablet    Sig: Take 1 tablet by mouth once for procedure.    Dispense:  1 tablet    Refill:  0    Disregard 50 mg dose    Patient Instructions  Medication Instructions:  Take Metoprolol 100 mg two hours before the CT when scheduled.   *If you need a refill on your cardiac medications before your next appointment, please call your pharmacy*   Lab Work: BMET, LIPID (come back for blood work, either here or [place])  If you have labs (blood work) drawn today and your tests are completely normal, you will receive your results only by: MyChart Message (if you have MyChart) OR A paper copy in the mail If you have any lab test that is abnormal or we need to change your treatment, we will call you to review the results.   Testing/Procedures: Your physician has requested that you have cardiac CT. Cardiac computed tomography (CT) is a painless test that uses an x-ray machine to take clear, detailed pictures of your heart. For further information please visit HugeFiesta.[email or web address]. Please follow instruction sheet as given.    Follow-Up: At [facility], you and your health needs are our priority.  As part of our continuing mission to provide you with exceptional heart care, we have created designated Provider Care Teams.  These Care Teams include your primary Cardiologist (physician) and Advanced Practice Providers (APPs -  Physician Assistants and Nurse Practitioners) who all work together to provide you with the care you need, when you need it.  We recommend signing up for the patient portal called "MyChart".  Sign up information is provided on this After Visit Summary.  MyChart is used to connect with patients for Virtual Visits (Telemedicine).  Patients are able to view lab/test results, encounter notes, upcoming appointments, etc.  Non-urgent messages  can be sent to your provider as well.   To learn more about what you can do with MyChart, go to [email or web address].    Your next appointment:   3 month(s)  The format for your next appointment:   In Person  Provider:   Eleonore Chiquito, MD   Other Instructions   Your cardiac CT will be scheduled at one of the below locations:   [facility] [address] [place], [place] [address] [phone]  If scheduled at [facility], please arrive at the [facility] main entrance (entrance A) of [facility] 30 minutes prior to test start time. Proceed to the [facility] Radiology Department (first floor) to check-in and test prep.   Please follow these instructions carefully (unless otherwise directed):   On the Night Before the Test: Be sure to Drink plenty of water. Do not consume any caffeinated/decaffeinated beverages or chocolate 12 hours prior to your test. Do not take any antihistamines 12 hours prior to your test.  On the Day of the Test: Drink plenty of water until 1 hour prior to the test. Do not eat any food 4 hours prior to the test. You may take your regular medications prior to the test.  Take metoprolol (Lopressor) two hours prior to test. HOLD Furosemide/Hydrochlorothiazide morning of the test. FEMALES- please wear underwire-free bra if available, avoid dresses & tight clothing       After the Test: Drink plenty of water.  After receiving IV contrast, you may experience a mild flushed feeling. This is normal. On occasion, you may experience a mild rash up to 24 hours after the test. This is not dangerous. If this occurs, you can take Benadryl 25 mg and increase your fluid intake. If you experience trouble breathing, this can be serious. If it is severe call 911 IMMEDIATELY. If it is mild, please call our office. If you take any of these medications: Glipizide/Metformin, Avandament, Glucavance, please do not take 48 hours after  completing test unless otherwise instructed.  Please allow 2-4 weeks for scheduling of routine cardiac CTs. Some insurance companies require a pre-authorization which may delay scheduling of this test.   For non-scheduling related questions, please contact the cardiac imaging nurse navigator should you have any questions/concerns: Marchia Bond, Cardiac Imaging Nurse Navigator Gordy Clement, Cardiac Imaging Nurse Navigator [facility] Direct Office Dial: [phone]   For scheduling needs, including cancellations and rescheduling, please call [place], [phone].     Signed, Addison Naegeli. Audie Box, MD, [place]  [address], [place], [place] [address] [phone]  06/07/2021 4:29 PM

## 2021-06-07 ENCOUNTER — Other Ambulatory Visit: Payer: Self-pay

## 2021-06-07 ENCOUNTER — Encounter (HOSPITAL_BASED_OUTPATIENT_CLINIC_OR_DEPARTMENT_OTHER): Payer: Self-pay | Admitting: Cardiovascular Disease

## 2021-06-07 ENCOUNTER — Ambulatory Visit (INDEPENDENT_AMBULATORY_CARE_PROVIDER_SITE_OTHER): Payer: Medicare Other | Admitting: Cardiovascular Disease

## 2021-06-07 ENCOUNTER — Telehealth (HOSPITAL_BASED_OUTPATIENT_CLINIC_OR_DEPARTMENT_OTHER): Payer: Self-pay | Admitting: Cardiovascular Disease

## 2021-06-07 VITALS — BP 142/78 | HR 68 | Ht 64.5 in | Wt 112.7 lb

## 2021-06-07 DIAGNOSIS — R079 Chest pain, unspecified: Secondary | ICD-10-CM | POA: Diagnosis not present

## 2021-06-07 DIAGNOSIS — I2510 Atherosclerotic heart disease of native coronary artery without angina pectoris: Secondary | ICD-10-CM | POA: Diagnosis not present

## 2021-06-07 DIAGNOSIS — Z720 Tobacco use: Secondary | ICD-10-CM

## 2021-06-07 DIAGNOSIS — R072 Precordial pain: Secondary | ICD-10-CM | POA: Diagnosis not present

## 2021-06-07 DIAGNOSIS — F17210 Nicotine dependence, cigarettes, uncomplicated: Secondary | ICD-10-CM

## 2021-06-07 DIAGNOSIS — E782 Mixed hyperlipidemia: Secondary | ICD-10-CM

## 2021-06-07 MED ORDER — METOPROLOL TARTRATE 100 MG PO TABS: ORAL_TABLET | ORAL | 0 refills | Status: DC

## 2021-06-07 MED ORDER — METOPROLOL TARTRATE 50 MG PO TABS: ORAL_TABLET | ORAL | 0 refills | Status: DC

## 2021-06-07 NOTE — Telephone Encounter (Signed)
Left message for patient to call and confirm /reschedule 06/07/21 appt with Dr. Audie Box

## 2021-06-07 NOTE — Patient Instructions (Signed)
Medication Instructions:  Take Metoprolol 100 mg two hours before the CT when scheduled.   *If you need a refill on your cardiac medications before your next appointment, please call your pharmacy*   Lab Work: BMET, LIPID (come back for blood work, either here or [place])  If you have labs (blood work) drawn today and your tests are completely normal, you will receive your results only by: MyChart Message (if you have MyChart) OR A paper copy in the mail If you have any lab test that is abnormal or we need to change your treatment, we will call you to review the results.   Testing/Procedures: Your physician has requested that you have cardiac CT. Cardiac computed tomography (CT) is a painless test that uses an x-ray machine to take clear, detailed pictures of your heart. For further information please visit HugeFiesta.tn. Please follow instruction sheet as given.    Follow-Up: At [facility], you and your health needs are our priority.  As part of our continuing mission to provide you with exceptional heart care, we have created designated Provider Care Teams.  These Care Teams include your primary Cardiologist (physician) and Advanced Practice Providers (APPs -  Physician Assistants and Nurse Practitioners) who all work together to provide you with the care you need, when you need it.  We recommend signing up for the patient portal called "MyChart".  Sign up information is provided on this After Visit Summary.  MyChart is used to connect with patients for Virtual Visits (Telemedicine).  Patients are able to view lab/test results, encounter notes, upcoming appointments, etc.  Non-urgent messages can be sent to your provider as well.   To learn more about what you can do with MyChart, go to [email or web address].    Your next appointment:   3 month(s)  The format for your next appointment:   In Person  Provider:   Eleonore Chiquito, MD   Other  Instructions   Your cardiac CT will be scheduled at one of the below locations:   [facility] [address] [place], [place] [address] [phone]  If scheduled at [facility], please arrive at the [facility] main entrance (entrance A) of [facility] 30 minutes prior to test start time. Proceed to the [facility] Radiology Department (first floor) to check-in and test prep.   Please follow these instructions carefully (unless otherwise directed):   On the Night Before the Test: Be sure to Drink plenty of water. Do not consume any caffeinated/decaffeinated beverages or chocolate 12 hours prior to your test. Do not take any antihistamines 12 hours prior to your test.  On the Day of the Test: Drink plenty of water until 1 hour prior to the test. Do not eat any food 4 hours prior to the test. You may take your regular medications prior to the test.  Take metoprolol (Lopressor) two hours prior to test. HOLD Furosemide/Hydrochlorothiazide morning of the test. FEMALES- please wear underwire-free bra if available, avoid dresses & tight clothing       After the Test: Drink plenty of water. After receiving IV contrast, you may experience a mild flushed feeling. This is normal. On occasion, you may experience a mild rash up to 24 hours after the test. This is not dangerous. If this occurs, you can take Benadryl 25 mg and increase your fluid intake. If you experience trouble breathing, this can be serious. If it is severe call 911 IMMEDIATELY. If it is mild, please  call our office. If you take any of these medications: Glipizide/Metformin, Avandament, Glucavance, please do not take 48 hours after completing test unless otherwise instructed.  Please allow 2-4 weeks for scheduling of routine cardiac CTs. Some insurance companies require a pre-authorization which may delay scheduling of this test.   For non-scheduling related questions, please contact the  cardiac imaging nurse navigator should you have any questions/concerns: Marchia Bond, Cardiac Imaging Nurse Navigator Gordy Clement, Cardiac Imaging Nurse Navigator [place] [facility] Direct Office Dial: [phone]   For scheduling needs, including cancellations and rescheduling, please call [place], [phone].

## 2021-06-20 DIAGNOSIS — Z23 Encounter for immunization: Secondary | ICD-10-CM | POA: Diagnosis not present

## 2021-06-20 DIAGNOSIS — Z79899 Other long term (current) drug therapy: Secondary | ICD-10-CM | POA: Diagnosis not present

## 2021-06-20 DIAGNOSIS — E7800 Pure hypercholesterolemia, unspecified: Secondary | ICD-10-CM | POA: Diagnosis not present

## 2021-06-20 DIAGNOSIS — I2510 Atherosclerotic heart disease of native coronary artery without angina pectoris: Secondary | ICD-10-CM | POA: Diagnosis not present

## 2021-06-20 DIAGNOSIS — Z0001 Encounter for general adult medical examination with abnormal findings: Secondary | ICD-10-CM | POA: Diagnosis not present

## 2021-06-20 DIAGNOSIS — F17210 Nicotine dependence, cigarettes, uncomplicated: Secondary | ICD-10-CM | POA: Diagnosis not present

## 2021-06-20 DIAGNOSIS — E559 Vitamin D deficiency, unspecified: Secondary | ICD-10-CM | POA: Diagnosis not present

## 2021-07-01 ENCOUNTER — Telehealth (HOSPITAL_COMMUNITY): Payer: Self-pay | Admitting: Emergency Medicine

## 2021-07-01 NOTE — Telephone Encounter (Signed)
Calling to clarify contrast allergy : gadolinium vs iodinated contrast  Pt states she had contrast reaction to gadolinium during MRI. Has had iodinated contrast without issues  With patients permission- iodinated contrast agents removed from allergy list  Marchia Bond RN Navigator Cardiac [facility] [phone] Office  [phone] Cell

## 2021-07-03 ENCOUNTER — Ambulatory Visit (HOSPITAL_COMMUNITY): Payer: Medicare Other

## 2021-07-03 DIAGNOSIS — M4125 Other idiopathic scoliosis, thoracolumbar region: Secondary | ICD-10-CM | POA: Diagnosis not present

## 2021-07-03 DIAGNOSIS — G894 Chronic pain syndrome: Secondary | ICD-10-CM | POA: Diagnosis not present

## 2021-07-03 DIAGNOSIS — Z79891 Long term (current) use of opiate analgesic: Secondary | ICD-10-CM | POA: Diagnosis not present

## 2021-07-03 DIAGNOSIS — G620 Drug-induced polyneuropathy: Secondary | ICD-10-CM | POA: Diagnosis not present

## 2021-07-08 ENCOUNTER — Telehealth (HOSPITAL_COMMUNITY): Payer: Self-pay | Admitting: *Deleted

## 2021-07-08 ENCOUNTER — Other Ambulatory Visit (HOSPITAL_COMMUNITY): Payer: Self-pay | Admitting: *Deleted

## 2021-07-08 MED ORDER — METOPROLOL TARTRATE 100 MG PO TABS: ORAL_TABLET | ORAL | 0 refills | Status: DC

## 2021-07-08 NOTE — Telephone Encounter (Signed)
Reaching out to patient to offer assistance regarding upcoming cardiac imaging study; pt verbalizes understanding of appt date/time, parking situation and where to check in, pre-test NPO status and medications ordered, and verified current allergies; name and call back number provided for further questions should they arise  Gordy Clement RN Navigator Cardiac Imaging [facility] [phone] office [phone] cell  Patient to take 100mg  metoprolol tartrate two hours prior to cardiac CT scan. Resent prescription since original prescription was sent to the wrong pharmacy.

## 2021-07-10 ENCOUNTER — Other Ambulatory Visit: Payer: Self-pay

## 2021-07-10 ENCOUNTER — Ambulatory Visit (HOSPITAL_COMMUNITY)
Admission: RE | Admit: 2021-07-10 | Discharge: 2021-07-10 | Disposition: A | Discharge status: Home / Self Care | Payer: Medicare Other | Source: Ambulatory Visit | Attending: Cardiovascular Disease | Admitting: Cardiovascular Disease

## 2021-07-10 ENCOUNTER — Ambulatory Visit (HOSPITAL_COMMUNITY): Payer: Medicare Other

## 2021-07-10 ENCOUNTER — Other Ambulatory Visit: Payer: Self-pay | Admitting: Internal Medicine

## 2021-07-10 ENCOUNTER — Ambulatory Visit (HOSPITAL_COMMUNITY)
Admission: RE | Admit: 2021-07-10 | Discharge: 2021-07-10 | Disposition: A | Discharge status: Home / Self Care | Payer: Medicare Other | Source: Ambulatory Visit | Attending: Internal Medicine | Admitting: Internal Medicine

## 2021-07-10 DIAGNOSIS — R079 Chest pain, unspecified: Secondary | ICD-10-CM | POA: Diagnosis not present

## 2021-07-10 DIAGNOSIS — R931 Abnormal findings on diagnostic imaging of heart and coronary circulation: Secondary | ICD-10-CM

## 2021-07-10 DIAGNOSIS — I700 Atherosclerosis of aorta: Secondary | ICD-10-CM | POA: Diagnosis not present

## 2021-07-10 DIAGNOSIS — R072 Precordial pain: Secondary | ICD-10-CM | POA: Diagnosis not present

## 2021-07-10 MED ORDER — IOHEXOL 350 MG/ML SOLN
95.0000 mL | Freq: Once | INTRAVENOUS | Status: AC | PRN
  Administered 2021-07-10: 95 mL via INTRAVENOUS

## 2021-07-10 MED ORDER — NITROGLYCERIN 0.4 MG SL SUBL
SUBLINGUAL_TABLET | SUBLINGUAL | Status: AC
  Filled 2021-07-10: qty 2

## 2021-07-10 MED ORDER — NITROGLYCERIN 0.4 MG SL SUBL
0.8000 mg | SUBLINGUAL_TABLET | Freq: Once | SUBLINGUAL | Status: AC
  Administered 2021-07-10: 0.8 mg via SUBLINGUAL

## 2021-07-10 NOTE — Progress Notes (Signed)
Please send for FFR - Dr. Roe Koffman 

## 2021-07-12 ENCOUNTER — Other Ambulatory Visit: Payer: Self-pay

## 2021-07-12 MED ORDER — METOPROLOL SUCCINATE ER 25 MG PO TB24: 25.0000 mg | ORAL_TABLET | Freq: Every day | ORAL | 3 refills | Status: DC

## 2021-07-12 MED ORDER — NITROGLYCERIN 0.4 MG SL SUBL: 0.4000 mg | SUBLINGUAL_TABLET | SUBLINGUAL | 3 refills | Status: DC | PRN

## 2021-07-24 ENCOUNTER — Ambulatory Visit: Payer: Medicare Other | Admitting: Cardiovascular Disease

## 2021-07-31 DIAGNOSIS — G894 Chronic pain syndrome: Secondary | ICD-10-CM | POA: Diagnosis not present

## 2021-07-31 DIAGNOSIS — G620 Drug-induced polyneuropathy: Secondary | ICD-10-CM | POA: Diagnosis not present

## 2021-07-31 DIAGNOSIS — M4125 Other idiopathic scoliosis, thoracolumbar region: Secondary | ICD-10-CM | POA: Diagnosis not present

## 2021-07-31 DIAGNOSIS — Z79891 Long term (current) use of opiate analgesic: Secondary | ICD-10-CM | POA: Diagnosis not present

## 2021-09-11 DIAGNOSIS — G894 Chronic pain syndrome: Secondary | ICD-10-CM | POA: Diagnosis not present

## 2021-09-11 DIAGNOSIS — G620 Drug-induced polyneuropathy: Secondary | ICD-10-CM | POA: Diagnosis not present

## 2021-09-11 DIAGNOSIS — M4125 Other idiopathic scoliosis, thoracolumbar region: Secondary | ICD-10-CM | POA: Diagnosis not present

## 2021-09-11 DIAGNOSIS — Z79891 Long term (current) use of opiate analgesic: Secondary | ICD-10-CM | POA: Diagnosis not present

## 2021-10-09 DIAGNOSIS — Z79891 Long term (current) use of opiate analgesic: Secondary | ICD-10-CM | POA: Diagnosis not present

## 2021-10-09 DIAGNOSIS — G894 Chronic pain syndrome: Secondary | ICD-10-CM | POA: Diagnosis not present

## 2021-10-09 DIAGNOSIS — G620 Drug-induced polyneuropathy: Secondary | ICD-10-CM | POA: Diagnosis not present

## 2021-10-09 DIAGNOSIS — M4125 Other idiopathic scoliosis, thoracolumbar region: Secondary | ICD-10-CM | POA: Diagnosis not present

## 2021-10-17 NOTE — Progress Notes (Signed)
Cardiology Office Note:   Date:  10/18/2021  NAME:  Tamara Webb    MRN: [id number] DOB:  07/05/1956   PCP:  Janifer Adie, MD  Cardiologist:  None  Electrophysiologist:  None   Referring MD: Janifer Adie, MD   Chief Complaint  Patient presents with   Follow-up   History of Present Illness:   Tamara Webb is a 66 y.o. female with a hx of tobacco abuse, nonobstructive CAD, hyperlipidemia who presents for follow-up.  She reports she still has episodes of chest pain.  Described as sharp pain.  Also has back pain.  She does suffer from chronic pain due to prior breast cancer.  Her pain symptoms appear to occur at rest.  Can also occur with activity.  Can really occur anytime.  No identifiable trigger.  No alleviating factor.  Nitroglycerin does not appear to help.  She does have obstructive CAD in the distal LAD.  Results were reviewed with her in office today.  Her Lipitor was increased to 20 mg last visit.  LDL 224.  It is down to 156 on most recent lab check.  We discussed increasing this to 40 mg to get it below 70.  Suspect she has familial hyperlipidemia.  Her blood pressure slightly elevated but reports it is well controlled at home.  She reports she is not exercising.  Still smoking 1 pack/day.  I did advise her to quit smoking.  Denies any symptoms of heart failure.  We discussed getting an echocardiogram as well as continuing further titration of her cholesterol medications in office today.  Problem List Breast CA -S/p chemo/Rx/lumpectomy 1999 -recurrence 2006 2. Tobacco abuse  3. CAD -severe mLAD stenosis (CT FFR 0.86) -lung CA screening CT 02/12/2021 4. HLD -Total cholesterol 220, HDL 50, LDL 154, salicylate 79  Past Medical History: Past Medical History:  Diagnosis Date   Allergy    Breast cancer ([place])    bilateral    Cancer ([place]) 1999&2006   breast cancer x2    COPD (chronic obstructive pulmonary disease) ([place])    Coronary artery disease    Family history  of breast cancer    Heart murmur 1999   Hyperlipidemia    Neuropathy    Neuropathy due to chemotherapeutic drug ([place])    breast cancer , Dr Truddie Coco.     Past Surgical History: Past Surgical History:  Procedure Laterality Date   APPENDECTOMY     BREAST RECONSTRUCTION     CARPAL TUNNEL RELEASE     right   ECTOPIC PREGNANCY SURGERY     LYMPH GLAND EXCISION     left arm   MASTECTOMY  09/08/2004   bil.mastectomy with abdominal trans flap.   OVARY SURGERY     after ectopic sx   TONSILLECTOMY  as child   TOTAL ABDOMINAL HYSTERECTOMY  09/09/1999    Current Medications: Current Meds  Medication Sig   Cholecalciferol (VITAMIN D PO) Take 5,000 Units by mouth daily.   gabapentin (NEURONTIN) 600 MG tablet Take 1 tablet (600 mg total) by mouth 3 (three) times daily. (Patient taking differently: Take 600 mg by mouth QID.)   metoprolol succinate (TOPROL-XL) 25 MG 24 hr tablet Take 1 tablet (25 mg total) by mouth daily. Take with or immediately following a meal.   MS CONTIN 60 MG 12 hr tablet SMARTSIG:1 Tablet(s) By Mouth Every 12 Hours   Multiple Vitamin (MULTIVITAMIN) tablet Take 1 tablet by mouth daily.   nitroGLYCERIN (NITROSTAT) 0.4 MG  SL tablet Place 1 tablet (0.4 mg total) under the tongue every 5 (five) minutes as needed for chest pain.   oxyCODONE (OXY IR/ROXICODONE) 5 MG immediate release tablet Take 5 mg by mouth 3 (three) times daily as needed.   [DISCONTINUED] atorvastatin (LIPITOR) 10 MG tablet 1 tablet   [DISCONTINUED] atorvastatin (LIPITOR) 20 MG tablet Take 20 mg by mouth daily.     Allergies:    Gadolinium derivatives, Penicillins, and Pepto-bismol [bismuth subsalicylate]   Social History: Social History   Socioeconomic History   Marital status: Married    Spouse name: Doctor, general practice    Number of children: 0   Years of education: 12+   Highest education level: Not on file  Occupational History    Employer: UNEMPLOYED   Occupation: Disabled  Tobacco Use   Smoking status:  Every Day    Packs/day: 1.00    Years: 36.00    Pack years: 36.00    Types: Cigarettes   Smokeless tobacco: Never   Tobacco comments:    quit for 20 years,just started back in 2000  Substance and Sexual Activity   Alcohol use: No    Alcohol/week: 0.0 standard drinks   Drug use: No   Sexual activity: Yes    Birth control/protection: Surgical  Other Topics Concern   Not on file  Social History Narrative   Patient lives with [name] her husband.    Patient has no children.    Patient is currently not working.    Patient has some college.    Social Determinants of Health   Financial Resource Strain: Not on file  Food Insecurity: Not on file  Transportation Needs: Not on file  Physical Activity: Not on file  Stress: Not on file  Social Connections: Not on file     Family History: The patient's family history includes Breast cancer in her cousin and paternal aunt; Cancer in her father and paternal grandfather; Colon polyps (age of onset: 44) in her sister; Dementia in her maternal grandmother; Diabetes in her father, mother, and sister; Heart attack in her father, maternal grandfather, and mother; Multiple sclerosis in her mother; Pulmonary embolism in her paternal grandmother; Stroke in her mother. There is no history of Colon cancer.  ROS:   All other ROS reviewed and negative. Pertinent positives noted in the HPI.     EKGs/Labs/Other Studies Reviewed:   The following studies were personally reviewed by me today:  CCTA 07/10/2021 IMPRESSION: 1. Suspect severe stenosis of the mid-LAD, CADRADS = 4a/HRP. CT FFR will be performed and reported separately.   2. Coronary calcium score of 350. This was 95th percentile for age and sex matched control.   3. Normal coronary origin with left dominance.   4. Aortic atherosclerosis including heavy atheroma burden of the descending aorta.   5. Would recommend definitive cardiac catheterization.  CT FFR 07/10/2021 1. Left Main:  No  significant stenosis. FFR = 1.00   2. LAD: Significant stenosis. Proximal FFR = 0.98, Mid FFR = 0.86, Distal FFR = 0.64 (discrete at the area of described lesion) 3. LCX: No significant stenosis. Proximal FFR = 0.97, Distal FFR = 0.90 4. RCA (non-dominant): No significant stenosis. Proximal FFR =0.98, Mid FFR = N/A, Distal FFR = N/A   IMPRESSION: 1. CT FFR does show a significant discrete stenosis of the mid-distal LAD which is flow-limiting.   2.  Consider further evaluation with cardiac catheterization.  Recent Labs: No results found for requested labs within last 8760 hours.  Recent Lipid Panel No results found for: CHOL, TRIG, HDL, CHOLHDL, VLDL, LDLCALC, LDLDIRECT  Physical Exam:   VS:  BP (!) 148/68    Pulse (!) 48    Ht 5\' 5"  (1.651 m)    Wt 113 lb 9.6 oz (51.5 kg)    SpO2 96%    BMI 18.90 kg/m    Wt Readings from Last 3 Encounters:  10/18/21 113 lb 9.6 oz (51.5 kg)  06/07/21 112 lb 11.2 oz (51.1 kg)  03/07/21 113 lb 9.6 oz (51.5 kg)    General: Well nourished, well developed, in no acute distress Head: Atraumatic, normal size  Eyes: PEERLA, EOMI  Neck: Supple, no JVD Endocrine: No thryomegaly Cardiac: Normal S1, S2; RRR; no murmurs, rubs, or gallops Lungs: Clear to auscultation bilaterally, no wheezing, rhonchi or rales  Abd: Soft, nontender, no hepatomegaly  Ext: No edema, pulses 2+ Musculoskeletal: No deformities, BUE and BLE strength normal and equal Skin: Warm and dry, no rashes   Neuro: Alert and oriented to person, place, time, and situation, CNII-XII grossly intact, no focal deficits  Psych: Normal mood and affect   ASSESSMENT:   Tamara Webb is a 66 y.o. female who presents for the following: 1. Coronary artery disease involving native coronary artery of native heart without angina pectoris   2. Mixed hyperlipidemia   3. Tobacco abuse     PLAN:   1. Coronary artery disease involving native coronary artery of native heart without angina  pectoris 2. Mixed hyperlipidemia -Coronary CTA with elevated calcium score.  Also with severe mid to distal LAD disease that is positive by CT FFR.  She suffers from chronic pain and her symptoms are very difficult to delineate.  I do not believe this represents angina.  I have recommended to continue with medical therapy.  She will take metoprolol succinate 25 mg daily.  She is on aspirin 81 mg daily.  LDL was 224.  Suspect she has familial hyperlipidemia.  We will further increase her Lipitor to 40 mg daily.  She will come back in 4 to 6 weeks for fasting lipid profile.  We will also obtain an echocardiogram to make sure her LV function is normal and she has no regional wall motion abnormalities.  We discussed that she can be treated medically.  There is nothing dangerous here.  Her symptoms are difficult to delineate and do not appear to represent angina.  3. Tobacco abuse -Smoking is a big issue here.  She is still smoking 1 pack/day.  4 minutes of smoking cessation counseling was provided in office today.  This will really reduce her risk of heart disease.  Disposition: Return in about 6 months (around 04/17/2022).  Medication Adjustments/Labs and Tests Ordered: Current medicines are reviewed at length with the patient today.  Concerns regarding medicines are outlined above.  Orders Placed This Encounter  Procedures   Lipid panel   Meds ordered this encounter  Medications   atorvastatin (LIPITOR) 40 MG tablet    Sig: Take 1 tablet (40 mg total) by mouth daily.    Dispense:  90 tablet    Refill:  1    Patient Instructions  Medication Instructions:  Increase Lipitor 40 mg daily   *If you need a refill on your cardiac medications before your next appointment, please call your pharmacy*   Lab Work: LIPID (6 weeks, no lab appointment needed, come fasting- nothing to eat or drink)   If you have labs (blood work) drawn today and  your tests are completely normal, you will receive your  results only by: MyChart Message (if you have MyChart) OR A paper copy in the mail If you have any lab test that is abnormal or we need to change your treatment, we will call you to review the results.   Testing/Procedures: Echocardiogram - Your physician has requested that you have an echocardiogram. Echocardiography is a painless test that uses sound waves to create images of your heart. It provides your doctor with information about the size and shape of your heart and how well your hearts chambers and valves are working. This procedure takes approximately one hour. There are no restrictions for this procedure. This will be performed at either our [facility] location - [address], [place] location [facility] 2nd floor.    Follow-Up: At [facility], you and your health needs are our priority.  As part of our continuing mission to provide you with exceptional heart care, we have created designated Provider Care Teams.  These Care Teams include your primary Cardiologist (physician) and Advanced Practice Providers (APPs -  Physician Assistants and Nurse Practitioners) who all work together to provide you with the care you need, when you need it.  We recommend signing up for the patient portal called "MyChart".  Sign up information is provided on this After Visit Summary.  MyChart is used to connect with patients for Virtual Visits (Telemedicine).  Patients are able to view lab/test results, encounter notes, upcoming appointments, etc.  Non-urgent messages can be sent to your provider as well.   To learn more about what you can do with MyChart, go to [email or web address].    Your next appointment:   6 month(s)  The format for your next appointment:   In Person  Provider:   Eleonore Chiquito, MD   Other Instructions: Take blood pressure every other day.      Time Spent with Patient: I have spent a total of 35 minutes with patient reviewing hospital  notes, telemetry, EKGs, labs and examining the patient as well as establishing an assessment and plan that was discussed with the patient.  > 50% of time was spent in direct patient care.  Signed, Addison Naegeli. Audie Box, MD, [place]  [address] [place], [place] [address] [phone]  10/18/2021 1:25 PM

## 2021-10-18 ENCOUNTER — Encounter: Payer: Self-pay | Admitting: Cardiovascular Disease

## 2021-10-18 ENCOUNTER — Other Ambulatory Visit: Payer: Self-pay

## 2021-10-18 ENCOUNTER — Ambulatory Visit (INDEPENDENT_AMBULATORY_CARE_PROVIDER_SITE_OTHER): Payer: Medicare Other | Admitting: Cardiovascular Disease

## 2021-10-18 VITALS — BP 148/68 | HR 48 | Ht 65.0 in | Wt 113.6 lb

## 2021-10-18 DIAGNOSIS — E782 Mixed hyperlipidemia: Secondary | ICD-10-CM | POA: Diagnosis not present

## 2021-10-18 DIAGNOSIS — F17210 Nicotine dependence, cigarettes, uncomplicated: Secondary | ICD-10-CM | POA: Diagnosis not present

## 2021-10-18 DIAGNOSIS — I2510 Atherosclerotic heart disease of native coronary artery without angina pectoris: Secondary | ICD-10-CM

## 2021-10-18 DIAGNOSIS — Z720 Tobacco use: Secondary | ICD-10-CM | POA: Diagnosis not present

## 2021-10-18 MED ORDER — ATORVASTATIN CALCIUM 40 MG PO TABS: 40.0000 mg | ORAL_TABLET | Freq: Every day | ORAL | 1 refills | Status: DC

## 2021-10-18 NOTE — Addendum Note (Signed)
Addended by: Caprice Beaver T on: 10/18/2021 04:04 PM   Modules accepted: Orders

## 2021-10-18 NOTE — Patient Instructions (Addendum)
Medication Instructions:  Increase Lipitor 40 mg daily   *If you need a refill on your cardiac medications before your next appointment, please call your pharmacy*   Lab Work: LIPID (6 weeks, no lab appointment needed, come fasting- nothing to eat or drink)   If you have labs (blood work) drawn today and your tests are completely normal, you will receive your results only by: [place] (if you have MyChart) OR A paper copy in the mail If you have any lab test that is abnormal or we need to change your treatment, we will call you to review the results.   Testing/Procedures: Echocardiogram - Your physician has requested that you have an echocardiogram. Echocardiography is a painless test that uses sound waves to create images of your heart. It provides your doctor with information about the size and shape of your heart and how well your hearts chambers and valves are working. This procedure takes approximately one hour. There are no restrictions for this procedure. This will be performed at either our [facility] location - [address] [place] location [facility] 2nd floor.    Follow-Up: At [facility], you and your health needs are our priority.  As part of our continuing mission to provide you with exceptional heart care, we have created designated Provider Care Teams.  These Care Teams include your primary Cardiologist (physician) and Advanced Practice Providers (APPs -  Physician Assistants and Nurse Practitioners) who all work together to provide you with the care you need, when you need it.  We recommend signing up for the patient portal called "MyChart".  Sign up information is provided on this After Visit Summary.  MyChart is used to connect with patients for Virtual Visits (Telemedicine).  Patients are able to view lab/test results, encounter notes, upcoming appointments, etc.  Non-urgent messages can be sent to your provider as well.   To  learn more about what you can do with MyChart, go to [email or web address].    Your next appointment:   6 month(s)  The format for your next appointment:   In Person  Provider:   Eleonore Chiquito, MD   Other Instructions: Take blood pressure every other day.

## 2021-10-29 ENCOUNTER — Encounter (HOSPITAL_COMMUNITY): Payer: Self-pay | Admitting: Cardiovascular Disease

## 2021-11-06 DIAGNOSIS — G620 Drug-induced polyneuropathy: Secondary | ICD-10-CM | POA: Diagnosis not present

## 2021-11-06 DIAGNOSIS — M4125 Other idiopathic scoliosis, thoracolumbar region: Secondary | ICD-10-CM | POA: Diagnosis not present

## 2021-11-06 DIAGNOSIS — Z79891 Long term (current) use of opiate analgesic: Secondary | ICD-10-CM | POA: Diagnosis not present

## 2021-11-06 DIAGNOSIS — E782 Mixed hyperlipidemia: Secondary | ICD-10-CM | POA: Diagnosis not present

## 2021-11-06 DIAGNOSIS — G894 Chronic pain syndrome: Secondary | ICD-10-CM | POA: Diagnosis not present

## 2021-11-07 ENCOUNTER — Other Ambulatory Visit: Payer: Self-pay

## 2021-11-07 LAB — LIPID PANEL
Chol/HDL Ratio: 3.9 ratio (ref 0.0–4.4)
Cholesterol, Total: 201 mg/dL — ABNORMAL HIGH (ref 100–199)
HDL: 52 mg/dL (ref >39)
LDL Chol Calc (NIH): 131 mg/dL — ABNORMAL HIGH (ref 0–99)
Triglycerides: 99 mg/dL (ref 0–149)
VLDL Cholesterol Cal: 18 mg/dL (ref 5–40)

## 2021-11-07 MED ORDER — EZETIMIBE 10 MG PO TABS: 10.0000 mg | ORAL_TABLET | Freq: Every day | ORAL | 3 refills | Status: DC

## 2021-11-07 MED ORDER — ATORVASTATIN CALCIUM 80 MG PO TABS: 80.0000 mg | ORAL_TABLET | Freq: Every day | ORAL | 3 refills | Status: DC

## 2021-11-12 ENCOUNTER — Telehealth (HOSPITAL_COMMUNITY): Payer: Self-pay | Admitting: Cardiovascular Disease

## 2021-11-12 NOTE — Telephone Encounter (Addendum)
Just an FYI. ?We have made several attempts to contact this patient including sending a letter to schedule or reschedule their echocardiogram. We will be removing the patient from the echo WQ. ? ?10/29/21 MAILED LETTER [facility]  ?10/24/21 [facility] to schedule x 2 @ 12:05/[facility]  ?10/21/21 Called back and had to LVM @ 2:59/[facility]W  ?10/21/21 called and pt was sleeping and asked me to call her back in the pm/[facility] 10:32 ? ? ? ? ? ? ? ?Thank you  ?

## 2021-12-05 DIAGNOSIS — Z79891 Long term (current) use of opiate analgesic: Secondary | ICD-10-CM | POA: Diagnosis not present

## 2021-12-05 DIAGNOSIS — M4125 Other idiopathic scoliosis, thoracolumbar region: Secondary | ICD-10-CM | POA: Diagnosis not present

## 2021-12-05 DIAGNOSIS — G894 Chronic pain syndrome: Secondary | ICD-10-CM | POA: Diagnosis not present

## 2021-12-05 DIAGNOSIS — G620 Drug-induced polyneuropathy: Secondary | ICD-10-CM | POA: Diagnosis not present

## 2021-12-11 ENCOUNTER — Telehealth: Payer: Self-pay | Admitting: Cardiovascular Disease

## 2021-12-11 NOTE — Telephone Encounter (Signed)
Spoke with patient of Dr. Audie Box  ?She is unable to tolerate atorvastatin '80mg'$  or '40mg'$  (she decreased herself) ?She reports joint pain, muscle aches ?She has known neuropathy  ? ?She has only ever tried atorvastatin  ? ?Advised will send message to MD to review and advise if change to different statin is recommended ?

## 2021-12-11 NOTE — Telephone Encounter (Signed)
?  Pt c/o medication issue: ? ?1. Name of Medication: atorvastatin (LIPITOR) 80 MG tabletc ? ?2. How are you currently taking this medication (dosage and times per day)? Take 1 tablet (80 mg total) by mouth daily. ? ?3. Are you having a reaction (difficulty breathing--STAT)?  ? ?4. What is your medication issue? Pt said she is having a lt of bad joint pains when taking this meds  ?

## 2021-12-12 ENCOUNTER — Other Ambulatory Visit: Payer: Self-pay

## 2021-12-12 MED ORDER — ROSUVASTATIN CALCIUM 20 MG PO TABS: 20.0000 mg | ORAL_TABLET | Freq: Every day | ORAL | 3 refills | Status: DC

## 2021-12-12 NOTE — Telephone Encounter (Signed)
Contacted patient, advised of message below.  ?Crestor 20 mg called into pharmacy.  ? ?

## 2022-01-30 DIAGNOSIS — M4125 Other idiopathic scoliosis, thoracolumbar region: Secondary | ICD-10-CM | POA: Diagnosis not present

## 2022-01-30 DIAGNOSIS — G620 Drug-induced polyneuropathy: Secondary | ICD-10-CM | POA: Diagnosis not present

## 2022-01-30 DIAGNOSIS — G894 Chronic pain syndrome: Secondary | ICD-10-CM | POA: Diagnosis not present

## 2022-01-30 DIAGNOSIS — Z79891 Long term (current) use of opiate analgesic: Secondary | ICD-10-CM | POA: Diagnosis not present

## 2022-02-07 ENCOUNTER — Telehealth: Payer: Self-pay | Admitting: Cardiovascular Disease

## 2022-02-07 NOTE — Telephone Encounter (Signed)
Called patient, LVM to call back to discuss- advised I would send message to MD to review and would call her back with changes.   Left call back number if further questions/concerns.

## 2022-02-07 NOTE — Telephone Encounter (Signed)
Pt c/o medication issue:  1. Name of Medication: rosuvastatin (CRESTOR) 20 MG tablet  2. How are you currently taking this medication (dosage and times per day)? Not taking  3. Are you having a reaction (difficulty breathing--STAT)? Yes.   4. What is your medication issue?  Pt states this med was causing pain in legs. Pt stopped taking meds last Sunday 05/28  and pain got better and then after 3 or 4 days, she would try again and the pain would come back.  She said non-emergency, but would like call back to discuss this.

## 2022-02-12 ENCOUNTER — Telehealth: Payer: Self-pay | Admitting: Adult Health

## 2022-02-12 NOTE — Telephone Encounter (Signed)
Rescheduled appointment per provider template. Patient is aware of the changes made to her upcoming appointment 

## 2022-02-20 ENCOUNTER — Other Ambulatory Visit: Payer: Self-pay

## 2022-02-20 NOTE — Telephone Encounter (Signed)
Called patient, she was advised of the message below however she would like to see MD back in clinic first and discuss- I did give her option of speaking with the pharmacy team, but she states due to her financial situation they would like to just keep it with Dr.O'Neal for now.   Patient verbalized understanding.  Thankful for call back.   Will route to MD just as Juluis Rainier-

## 2022-03-06 ENCOUNTER — Inpatient Hospital Stay: Payer: Medicare Other | Admitting: Adult Health

## 2022-03-27 DIAGNOSIS — G620 Drug-induced polyneuropathy: Secondary | ICD-10-CM | POA: Diagnosis not present

## 2022-03-27 DIAGNOSIS — M4125 Other idiopathic scoliosis, thoracolumbar region: Secondary | ICD-10-CM | POA: Diagnosis not present

## 2022-03-27 DIAGNOSIS — Z79891 Long term (current) use of opiate analgesic: Secondary | ICD-10-CM | POA: Diagnosis not present

## 2022-03-27 DIAGNOSIS — G894 Chronic pain syndrome: Secondary | ICD-10-CM | POA: Diagnosis not present

## 2022-05-12 NOTE — Progress Notes (Signed)
Cardiology Office Note:   Date:  05/15/2022  NAME:  Tamara Webb    MRN: [id number] DOB:  05-Jan-1956   PCP:  Janifer Adie, MD  Cardiologist:  None  Electrophysiologist:  None   Referring MD: Janifer Adie, MD   Chief Complaint  Patient presents with   Follow-up    6 months.   History of Present Illness:   Tamara Webb is a 66 y.o. female with a hx of non-obstructive CAD, tobacco abuse, HLD who presents for follow-up. Statin intolerant. She reports occasional chest pain.  Sometimes improved with nitroglycerin.  She cannot delineate if this is neuropathy or actual chest pain.  Coronary CTA concerning for mid LAD stenosis.  We discussed definitive left heart catheterization.  For now she will continue with medical therapy.  She does report fatigue.  We discussed trialing amlodipine instead of metoprolol.  She is okay to do this.  She had issues with statins.  She reports she tolerated Lipitor 20 mg daily.  We will retry this.  She is not currently on any cholesterol medications.  She has Zetia as well but has not taken it.  We discussed that she should take this with the Lipitor.  Hopefully this can get her to goal.  She is not interested in PCSK9 inhibitor therapy or Leqvio.  She does not like the idea of injections.  She is still smoking.  Up to 1 pack/day.  Working on quitting.  Blood pressure is well controlled.  CV exam is normal.  She did not complete her echocardiogram.  She reports she would like to hold off due to financial reasons.  Problem List Breast CA -S/p chemo/Rx/lumpectomy 1999 -recurrence 2006 2. Tobacco abuse  3. CAD -severe mLAD stenosis (CT FFR 0.86) -lung CA screening CT 02/12/2021 4. HLD -Total cholesterol 201, HDL 52, LDL 131, triglycerides 99  Past Medical History: Past Medical History:  Diagnosis Date   Allergy    Breast cancer ([place])    bilateral    Cancer ([place]) 1999&2006   breast cancer x2    COPD (chronic obstructive pulmonary disease) ([facility])     Coronary artery disease    Family history of breast cancer    Heart murmur 1999   Hyperlipidemia    Neuropathy    Neuropathy due to chemotherapeutic drug ([place])    breast cancer , Dr Truddie Coco.     Past Surgical History: Past Surgical History:  Procedure Laterality Date   APPENDECTOMY     BREAST RECONSTRUCTION     CARPAL TUNNEL RELEASE     right   ECTOPIC PREGNANCY SURGERY     LYMPH GLAND EXCISION     left arm   MASTECTOMY  09/08/2004   bil.mastectomy with abdominal trans flap.   OVARY SURGERY     after ectopic sx   TONSILLECTOMY  as child   TOTAL ABDOMINAL HYSTERECTOMY  09/09/1999    Current Medications: Current Meds  Medication Sig   amLODipine (NORVASC) 5 MG tablet Take 1 tablet (5 mg total) by mouth daily.   atorvastatin (LIPITOR) 20 MG tablet Take 1 tablet (20 mg total) by mouth daily.     Allergies:    Gadolinium derivatives, Penicillins, and Pepto-bismol [bismuth subsalicylate]   Social History: Social History   Socioeconomic History   Marital status: Married    Spouse name: [name]    Number of children: 0   Years of education: 12+   Highest education level: Not on file  Occupational History  Employer: UNEMPLOYED   Occupation: Disabled  Tobacco Use   Smoking status: Every Day    Packs/day: 1.00    Years: 36.00    Total pack years: 36.00    Types: Cigarettes   Smokeless tobacco: Never   Tobacco comments:    quit for 20 years,just started back in 2000  Substance and Sexual Activity   Alcohol use: No    Alcohol/week: 0.0 standard drinks of alcohol   Drug use: No   Sexual activity: Yes    Birth control/protection: Surgical  Other Topics Concern   Not on file  Social History Narrative   Patient lives with [name] her husband.    Patient has no children.    Patient is currently not working.    Patient has some college.    Social Determinants of Health   Financial Resource Strain: Not on file  Food Insecurity: Not on file  Transportation Needs:  Not on file  Physical Activity: Not on file  Stress: Not on file  Social Connections: Not on file     Family History: The patient's family history includes Breast cancer in her cousin and paternal aunt; Cancer in her father and paternal grandfather; Colon polyps (age of onset: 28) in her sister; Dementia in her maternal grandmother; Diabetes in her father, mother, and sister; Heart attack in her father, maternal grandfather, and mother; Multiple sclerosis in her mother; Pulmonary embolism in her paternal grandmother; Stroke in her mother. There is no history of Colon cancer.  ROS:   All other ROS reviewed and negative. Pertinent positives noted in the HPI.     EKGs/Labs/Other Studies Reviewed:   The following studies were personally reviewed by me today:  CCTA 07/10/2021 IMPRESSION: 1. Suspect severe stenosis of the mid-LAD, CADRADS = 4a/HRP. CT FFR will be performed and reported separately.   2. Coronary calcium score of 350. This was 95th percentile for age and sex matched control.   3. Normal coronary origin with left dominance.   4. Aortic atherosclerosis including heavy atheroma burden of the descending aorta.   5. Would recommend definitive cardiac catheterization.  Recent Labs: No results found for requested labs within last 365 days.   Recent Lipid Panel    Component Value Date/Time   CHOL 201 (H) 11/06/2021 1554   TRIG 99 11/06/2021 1554   HDL 52 11/06/2021 1554   CHOLHDL 3.9 11/06/2021 1554   LDLCALC 131 (H) 11/06/2021 1554    Physical Exam:   VS:  BP 134/82 (BP Location: Right Arm, Patient Position: Sitting, Cuff Size: Normal)   Pulse 78   Ht '5\' 5"'$  (1.651 m)   Wt 115 lb (52.2 kg)   BMI 19.14 kg/m    Wt Readings from Last 3 Encounters:  05/15/22 115 lb (52.2 kg)  10/18/21 113 lb 9.6 oz (51.5 kg)  06/07/21 112 lb 11.2 oz (51.1 kg)    General: Well nourished, well developed, in no acute distress Head: Atraumatic, normal size  Eyes: PEERLA, EOMI  Neck:  Supple, no JVD Endocrine: No thryomegaly Cardiac: Normal S1, S2; RRR; no murmurs, rubs, or gallops Lungs: Clear to auscultation bilaterally, no wheezing, rhonchi or rales  Abd: Soft, nontender, no hepatomegaly  Ext: No edema, pulses 2+ Musculoskeletal: No deformities, BUE and BLE strength normal and equal Skin: Warm and dry, no rashes   Neuro: Alert and oriented to person, place, time, and situation, CNII-XII grossly intact, no focal deficits  Psych: Normal mood and affect   ASSESSMENT:   Tamara  NESHIA Webb is a 66 y.o. female who presents for the following: 1. Coronary artery disease involving native coronary artery of native heart without angina pectoris   2. Mixed hyperlipidemia   3. Tobacco abuse     PLAN:   1. Coronary artery disease involving native coronary artery of native heart without angina pectoris 2. Mixed hyperlipidemia -Severe mid LAD stenosis negative by CT FFR.  Continues to experience chest pain symptoms.  Reports fatigue with metoprolol.  Stop metoprolol.  Try amlodipine 5 mg daily.  Her CT FFR was reassuring.  We did discuss left heart catheterization for definitive evaluation.  She reports she would like to hold on any procedures.  I think this is reasonable.  She does really not have symptoms when she exerts herself.  It is difficult to delineate between her neuropathy and chronic pain.  We discussed getting an echocardiogram.  She would like to hold on this.  She is taking an aspirin.  Her cholesterol level is not at goal.  She had issues with statins.  She apparently tolerated lower doses of Lipitor.  She is not taking Zetia.  We will recheck lipids today.  She is willing to retry Lipitor 20 mg daily as well as Zetia.  Hopefully we can get her to goal.  She is not interested in injections.  I will see her back in 6 months to discuss further.  We may be able to get her echocardiogram done as well.  She had no prolonged symptoms of chest discomfort.  Again she would like a  conservative approach for now with medication.  3. Tobacco abuse -Still smoking.  Advised her to quit.  Disposition: Return in about 6 months (around 11/13/2022).  Medication Adjustments/Labs and Tests Ordered: Current medicines are reviewed at length with the patient today.  Concerns regarding medicines are outlined above.  Orders Placed This Encounter  Procedures   Lipid panel   Meds ordered this encounter  Medications   atorvastatin (LIPITOR) 20 MG tablet    Sig: Take 1 tablet (20 mg total) by mouth daily.    Dispense:  90 tablet    Refill:  3   amLODipine (NORVASC) 5 MG tablet    Sig: Take 1 tablet (5 mg total) by mouth daily.    Dispense:  90 tablet    Refill:  3    Patient Instructions  Medication Instructions:  STOP Crestor  STOP Metoprolol   START Lipitor 20 mg daily  START Amlodipine 5 mg daily   *If you need a refill on your cardiac medications before your next appointment, please call your pharmacy*   Lab Work: LIPID today   If you have labs (blood work) drawn today and your tests are completely normal, you will receive your results only by: [place] (if you have MyChart) OR A paper copy in the mail If you have any lab test that is abnormal or we need to change your treatment, we will call you to review the results.  Follow-Up: At [facility], you and your health needs are our priority.  As part of our continuing mission to provide you with exceptional heart care, we have created designated Provider Care Teams.  These Care Teams include your primary Cardiologist (physician) and Advanced Practice Providers (APPs -  Physician Assistants and Nurse Practitioners) who all work together to provide you with the care you need, when you need it.  We recommend signing up for the patient portal called "MyChart".  Sign up  information is provided on this After Visit Summary.  MyChart is used to connect with patients for Virtual Visits (Telemedicine).   Patients are able to view lab/test results, encounter notes, upcoming appointments, etc.  Non-urgent messages can be sent to your provider as well.   To learn more about what you can do with MyChart, go to [email or web address].    Your next appointment:   6 month(s)  The format for your next appointment:   In Person  Provider:   Eleonore Chiquito, MD        Time Spent with Patient: I have spent a total of 35 minutes with patient reviewing hospital notes, telemetry, EKGs, labs and examining the patient as well as establishing an assessment and plan that was discussed with the patient.  > 50% of time was spent in direct patient care.  Signed, Addison Naegeli. Audie Box, MD, [facility]  [address] [place], [place] [address] [phone]  05/15/2022 2:26 PM

## 2022-05-15 ENCOUNTER — Ambulatory Visit: Payer: Medicare Other | Attending: Cardiovascular Disease | Admitting: Cardiovascular Disease

## 2022-05-15 ENCOUNTER — Encounter: Payer: Self-pay | Admitting: Cardiovascular Disease

## 2022-05-15 VITALS — BP 134/82 | HR 78 | Ht 65.0 in | Wt 115.0 lb

## 2022-05-15 DIAGNOSIS — E782 Mixed hyperlipidemia: Secondary | ICD-10-CM

## 2022-05-15 DIAGNOSIS — Z720 Tobacco use: Secondary | ICD-10-CM | POA: Diagnosis not present

## 2022-05-15 DIAGNOSIS — I2510 Atherosclerotic heart disease of native coronary artery without angina pectoris: Secondary | ICD-10-CM

## 2022-05-15 MED ORDER — ATORVASTATIN CALCIUM 20 MG PO TABS: 20.0000 mg | ORAL_TABLET | Freq: Every day | ORAL | 3 refills | Status: DC

## 2022-05-15 MED ORDER — AMLODIPINE BESYLATE 5 MG PO TABS: 5.0000 mg | ORAL_TABLET | Freq: Every day | ORAL | 3 refills | Status: DC

## 2022-05-15 NOTE — Patient Instructions (Signed)
Medication Instructions:  STOP Crestor  STOP Metoprolol   START Lipitor 20 mg daily  START Amlodipine 5 mg daily   *If you need a refill on your cardiac medications before your next appointment, please call your pharmacy*   Lab Work: LIPID today   If you have labs (blood work) drawn today and your tests are completely normal, you will receive your results only by: [place] (if you have MyChart) OR A paper copy in the mail If you have any lab test that is abnormal or we need to change your treatment, we will call you to review the results.  Follow-Up: At [facility], you and your health needs are our priority.  As part of our continuing mission to provide you with exceptional heart care, we have created designated Provider Care Teams.  These Care Teams include your primary Cardiologist (physician) and Advanced Practice Providers (APPs -  Physician Assistants and Nurse Practitioners) who all work together to provide you with the care you need, when you need it.  We recommend signing up for the patient portal called "MyChart".  Sign up information is provided on this After Visit Summary.  MyChart is used to connect with patients for Virtual Visits (Telemedicine).  Patients are able to view lab/test results, encounter notes, upcoming appointments, etc.  Non-urgent messages can be sent to your provider as well.   To learn more about what you can do with MyChart, go to [email or web address].    Your next appointment:   6 month(s)  The format for your next appointment:   In Person  Provider:   Eleonore Chiquito, MD

## 2022-05-16 LAB — LIPID PANEL
Chol/HDL Ratio: 7.5 ratio — ABNORMAL HIGH (ref 0.0–4.4)
Cholesterol, Total: 347 mg/dL — ABNORMAL HIGH (ref 100–199)
HDL: 46 mg/dL (ref >39)
LDL Chol Calc (NIH): 284 mg/dL — ABNORMAL HIGH (ref 0–99)
Triglycerides: 102 mg/dL (ref 0–149)
VLDL Cholesterol Cal: 17 mg/dL (ref 5–40)

## 2022-05-29 DIAGNOSIS — G894 Chronic pain syndrome: Secondary | ICD-10-CM | POA: Diagnosis not present

## 2022-05-29 DIAGNOSIS — Z79891 Long term (current) use of opiate analgesic: Secondary | ICD-10-CM | POA: Diagnosis not present

## 2022-05-29 DIAGNOSIS — G620 Drug-induced polyneuropathy: Secondary | ICD-10-CM | POA: Diagnosis not present

## 2022-05-29 DIAGNOSIS — M4125 Other idiopathic scoliosis, thoracolumbar region: Secondary | ICD-10-CM | POA: Diagnosis not present

## 2022-07-28 DIAGNOSIS — G620 Drug-induced polyneuropathy: Secondary | ICD-10-CM | POA: Diagnosis not present

## 2022-07-28 DIAGNOSIS — M4125 Other idiopathic scoliosis, thoracolumbar region: Secondary | ICD-10-CM | POA: Diagnosis not present

## 2022-07-28 DIAGNOSIS — Z79891 Long term (current) use of opiate analgesic: Secondary | ICD-10-CM | POA: Diagnosis not present

## 2022-07-28 DIAGNOSIS — G894 Chronic pain syndrome: Secondary | ICD-10-CM | POA: Diagnosis not present

## 2022-08-02 ENCOUNTER — Other Ambulatory Visit: Payer: Self-pay | Admitting: Cardiovascular Disease

## 2022-08-28 IMAGING — CT CT CHEST LUNG CANCER SCREENING LOW DOSE W/O CM
1 series · 10 of 10 positions shown, 13 images · non-contrast
Comparison: 02/12/2017 chest radiograph. PET 01/05/2008. Chest CT
5157. No prior screening CT.

CLINICAL DATA: Thirty-six pack-year smoking history; current
smoker. Breast cancer with bilateral mastectomy.

EXAM:
CT CHEST WITHOUT CONTRAST LOW-DOSE FOR LUNG CANCER SCREENING
TECHNIQUE: Multidetector CT imaging of the chest was performed following the
standard protocol without IV contrast.

[ct lung segmentation data · axial · 0.62mm/px · z∈[-358,-358]mm · 10 of 341 frames shown]
[frame 1/341  mediastinal]
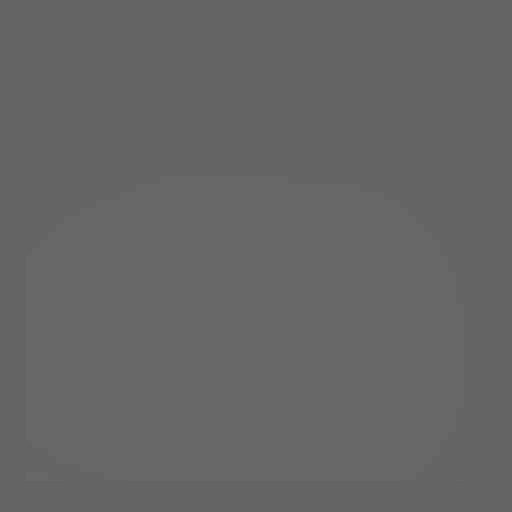
[frame 1/341  lung]
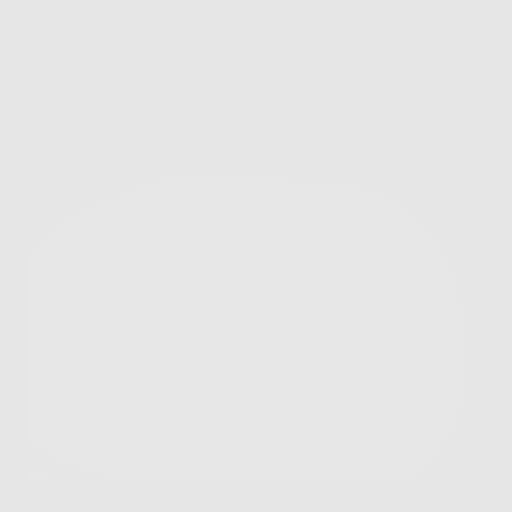
[frame 38/341  lung]
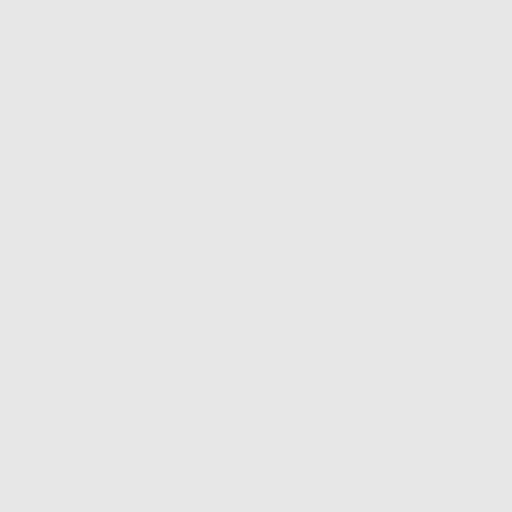
[frame 76/341  lung]
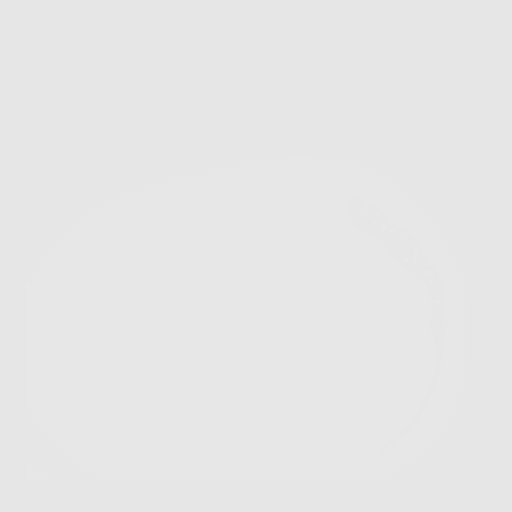
[frame 114/341  lung]
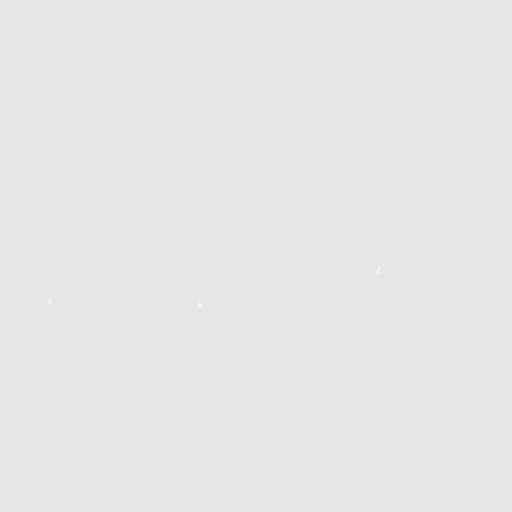
[frame 152/341  mediastinal]
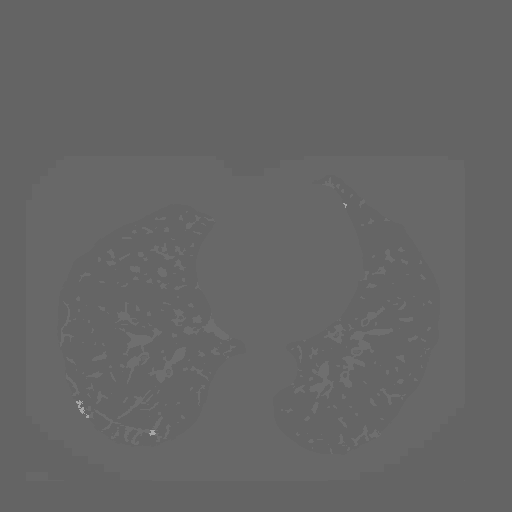
[frame 152/341  lung]
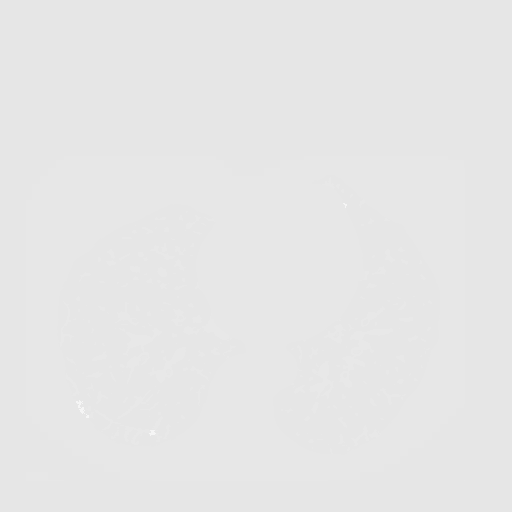
[frame 189/341  lung]
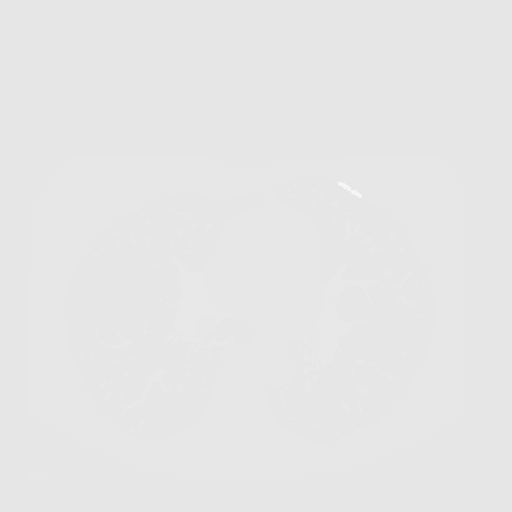
[frame 227/341  lung]
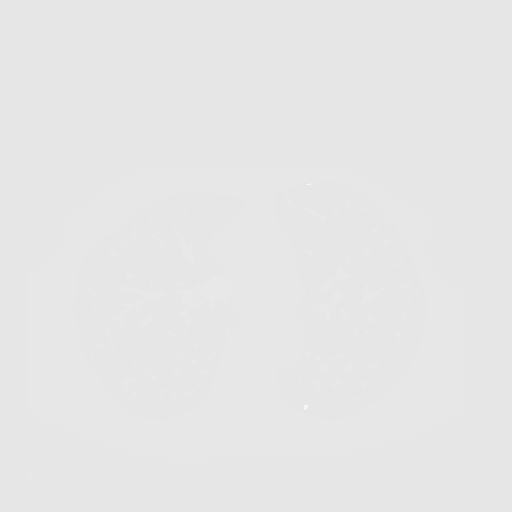
[frame 265/341  lung]
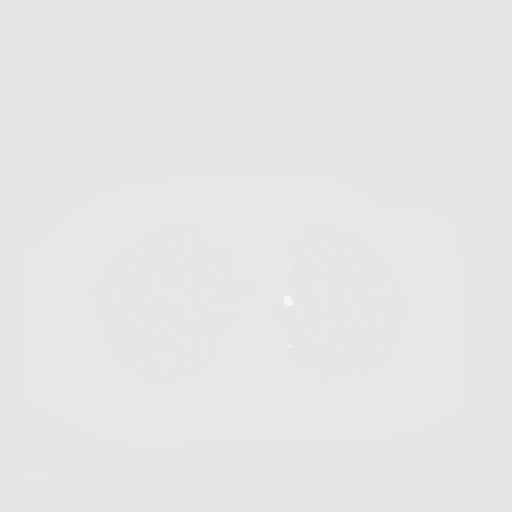
[frame 303/341  mediastinal]
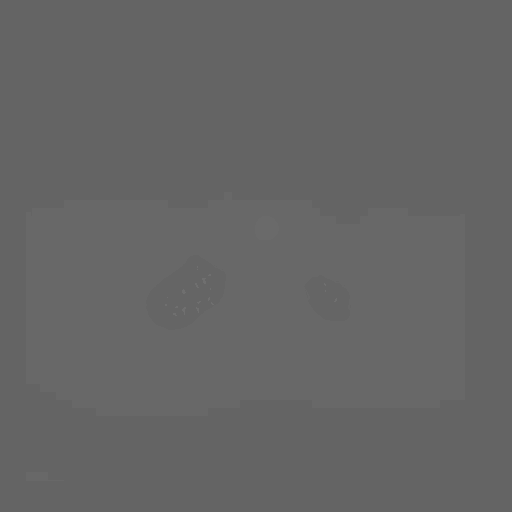
[frame 303/341  lung]
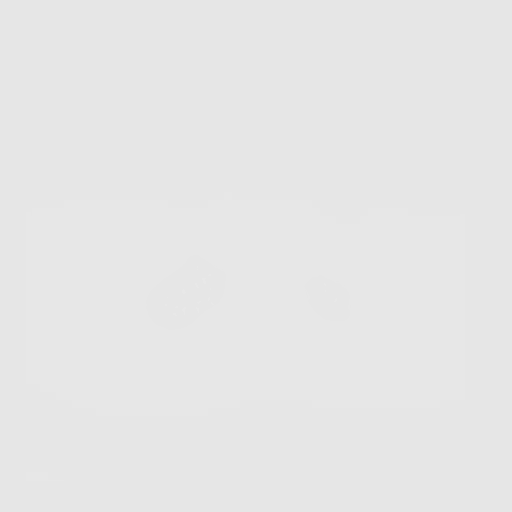
[frame 341/341  lung]
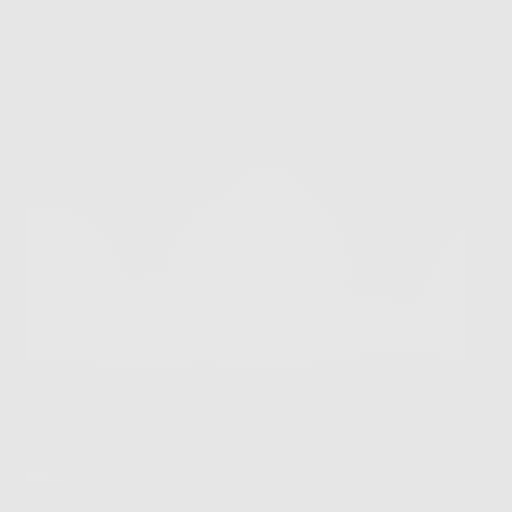

[10 of 10 positions shown; findings below may reference images not displayed]

FINDINGS: Cardiovascular: Aortic and branch vessel atherosclerosis. Heart size
central weighted by a pectus excavatum deformity. No pericardial
effusion. Lad and left circumflex coronary artery calcification.

Mediastinum/Nodes: Left axillary node dissection. No axillary
adenopathy.

No middle mediastinal adenopathy. Hilar regions poorly evaluated
without intravenous contrast.

An 8 mm prevascular soft tissue density on the [DATE] is at the site
of a similar-sized lower density focus on 12/31/2007, favoring a
reactive node versus pericardial thickening within the superior
portion of a pericardial recess.

Lungs/Pleura: No pleural fluid. Mild centrilobular emphysema.
Bilateral pulmonary nodules. The largest is in the right apex
posteriorly and measures volume derived equivalent diameter 7.7 mm,
including on 51/3. New since 03/02/06.

Upper Abdomen: Normal imaged portions of the liver, spleen, stomach,
pancreas, gallbladder, adrenal glands, right kidney. Left renal
sinus cysts. Colonic stool burden suggests constipation.

Musculoskeletal: Bilateral breast implants. No acute osseous
abnormality. Mild convex right thoracic spine curvature.
IMPRESSION: 1. Lung-RADS 3, probably benign findings. Short-term follow-up in 6
months is recommended with repeat low-dose chest CT without contrast
(please use the following order, "CT CHEST LCS NODULE FOLLOW-UP W/O
CM"). Bilateral pulmonary nodules, including dominant right apical
nodule which may represent developing pleuroparenchymal scar but is
indeterminate.
2. Age advanced coronary artery atherosclerosis. Recommend
assessment of coronary risk factors and consideration of medical
therapy.
3. Aortic atherosclerosis (63FTW-WYX.X) and emphysema (63FTW-IJA.7).
4. Probable reactive node area of pericardial thickening within the
prevascular space. Recommend attention on follow-up.

## 2022-09-29 DIAGNOSIS — M4125 Other idiopathic scoliosis, thoracolumbar region: Secondary | ICD-10-CM | POA: Diagnosis not present

## 2022-09-29 DIAGNOSIS — G620 Drug-induced polyneuropathy: Secondary | ICD-10-CM | POA: Diagnosis not present

## 2022-09-29 DIAGNOSIS — G894 Chronic pain syndrome: Secondary | ICD-10-CM | POA: Diagnosis not present

## 2022-09-29 DIAGNOSIS — Z79891 Long term (current) use of opiate analgesic: Secondary | ICD-10-CM | POA: Diagnosis not present

## 2022-11-26 DIAGNOSIS — Z79891 Long term (current) use of opiate analgesic: Secondary | ICD-10-CM | POA: Diagnosis not present

## 2022-11-26 DIAGNOSIS — M4125 Other idiopathic scoliosis, thoracolumbar region: Secondary | ICD-10-CM | POA: Diagnosis not present

## 2022-11-26 DIAGNOSIS — G894 Chronic pain syndrome: Secondary | ICD-10-CM | POA: Diagnosis not present

## 2022-11-26 DIAGNOSIS — G620 Drug-induced polyneuropathy: Secondary | ICD-10-CM | POA: Diagnosis not present

## 2022-12-26 ENCOUNTER — Encounter: Payer: Self-pay | Admitting: Gastroenterology

## 2023-01-21 ENCOUNTER — Other Ambulatory Visit: Payer: Self-pay | Admitting: Cardiovascular Disease

## 2023-01-26 ENCOUNTER — Encounter: Payer: Medicare Other | Admitting: Gastroenterology

## 2023-01-28 DIAGNOSIS — G620 Drug-induced polyneuropathy: Secondary | ICD-10-CM | POA: Diagnosis not present

## 2023-01-28 DIAGNOSIS — Z79891 Long term (current) use of opiate analgesic: Secondary | ICD-10-CM | POA: Diagnosis not present

## 2023-01-28 DIAGNOSIS — G894 Chronic pain syndrome: Secondary | ICD-10-CM | POA: Diagnosis not present

## 2023-01-28 DIAGNOSIS — M4125 Other idiopathic scoliosis, thoracolumbar region: Secondary | ICD-10-CM | POA: Diagnosis not present

## 2023-02-06 DIAGNOSIS — G8929 Other chronic pain: Secondary | ICD-10-CM | POA: Insufficient documentation

## 2023-02-06 DIAGNOSIS — G620 Drug-induced polyneuropathy: Secondary | ICD-10-CM | POA: Insufficient documentation

## 2023-02-12 ENCOUNTER — Other Ambulatory Visit: Payer: Self-pay

## 2023-02-12 ENCOUNTER — Ambulatory Visit (AMBULATORY_SURGERY_CENTER): Payer: Medicare Other

## 2023-02-12 VITALS — Ht 65.0 in | Wt 115.0 lb

## 2023-02-12 DIAGNOSIS — Z86010 Personal history of colonic polyps: Secondary | ICD-10-CM

## 2023-02-12 MED ORDER — NA SULFATE-K SULFATE-MG SULF 17.5-3.13-1.6 GM/177ML PO SOLN: 1.0000 | Freq: Once | ORAL | 0 refills | Status: AC

## 2023-02-12 NOTE — Progress Notes (Signed)
Denies allergies to eggs or soy products. Denies complication of anesthesia or sedation. Denies use of weight loss medication. Denies use of O2.   Emmi instructions given for colonoscopy.  

## 2023-03-04 ENCOUNTER — Ambulatory Visit (AMBULATORY_SURGERY_CENTER): Payer: Medicare Other | Admitting: Gastroenterology

## 2023-03-04 ENCOUNTER — Encounter: Payer: Self-pay | Admitting: Gastroenterology

## 2023-03-04 VITALS — BP 166/82 | HR 77 | Temp 98.9°F | Resp 15 | Ht 65.0 in | Wt 115.0 lb

## 2023-03-04 DIAGNOSIS — Z86010 Personal history of colonic polyps: Secondary | ICD-10-CM

## 2023-03-04 DIAGNOSIS — D120 Benign neoplasm of cecum: Secondary | ICD-10-CM | POA: Diagnosis not present

## 2023-03-04 DIAGNOSIS — I10 Essential (primary) hypertension: Secondary | ICD-10-CM | POA: Diagnosis not present

## 2023-03-04 DIAGNOSIS — D122 Benign neoplasm of ascending colon: Secondary | ICD-10-CM

## 2023-03-04 DIAGNOSIS — D125 Benign neoplasm of sigmoid colon: Secondary | ICD-10-CM | POA: Diagnosis not present

## 2023-03-04 DIAGNOSIS — D124 Benign neoplasm of descending colon: Secondary | ICD-10-CM | POA: Diagnosis not present

## 2023-03-04 DIAGNOSIS — Z09 Encounter for follow-up examination after completed treatment for conditions other than malignant neoplasm: Secondary | ICD-10-CM | POA: Diagnosis not present

## 2023-03-04 DIAGNOSIS — I2510 Atherosclerotic heart disease of native coronary artery without angina pectoris: Secondary | ICD-10-CM | POA: Diagnosis not present

## 2023-03-04 DIAGNOSIS — J449 Chronic obstructive pulmonary disease, unspecified: Secondary | ICD-10-CM | POA: Diagnosis not present

## 2023-03-04 DIAGNOSIS — K635 Polyp of colon: Secondary | ICD-10-CM | POA: Diagnosis not present

## 2023-03-04 MED ORDER — SODIUM CHLORIDE 0.9 % IV SOLN: 500.0000 mL | Freq: Once | INTRAVENOUS | Status: DC

## 2023-03-04 NOTE — Progress Notes (Signed)
Called to room to assist during endoscopic procedure.  Patient ID and intended procedure confirmed with present staff. Received instructions for my participation in the procedure from the performing physician.  

## 2023-03-04 NOTE — Progress Notes (Signed)
Sedate, gd SR's, VSS, report to RN 

## 2023-03-04 NOTE — Patient Instructions (Addendum)
-  Handout on polyps provided -await pathology results -repeat colonoscopy for surveillance recommended. Date to be determined when pathology result become available   -Continue present medications    YOU HAD AN ENDOSCOPIC PROCEDURE TODAY AT THE [facility]:   Refer to the procedure report that was given to you for any specific questions about what was found during the examination.  If the procedure report does not answer your questions, please call your gastroenterologist to clarify.  If you requested that your care partner not be given the details of your procedure findings, then the procedure report has been included in a sealed envelope for you to review at your convenience later.  YOU SHOULD EXPECT: Some feelings of bloating in the abdomen. Passage of more gas than usual.  Walking can help get rid of the air that was put into your GI tract during the procedure and reduce the bloating. If you had a lower endoscopy (such as a colonoscopy or flexible sigmoidoscopy) you may notice spotting of blood in your stool or on the toilet paper. If you underwent a bowel prep for your procedure, you may not have a normal bowel movement for a few days.  Please Note:  You might notice some irritation and congestion in your nose or some drainage.  This is from the oxygen used during your procedure.  There is no need for concern and it should clear up in a day or so.  SYMPTOMS TO REPORT IMMEDIATELY:  Following lower endoscopy (colonoscopy or flexible sigmoidoscopy):  Excessive amounts of blood in the stool  Significant tenderness or worsening of abdominal pains  Swelling of the abdomen that is new, acute  Fever of 100F or higher   For urgent or emergent issues, a gastroenterologist can be reached at any hour by calling [phone]. Do not use MyChart messaging for urgent concerns.    DIET:  We do recommend a small meal at first, but then you may proceed to your regular diet.  Drink plenty of  fluids but you should avoid alcoholic beverages for 24 hours.  ACTIVITY:  You should plan to take it easy for the rest of today and you should NOT DRIVE or use heavy machinery until tomorrow (because of the sedation medicines used during the test).    FOLLOW UP: Our staff will call the number listed on your records the next business day following your procedure.  We will call around 7:15- 8:00 am to check on you and address any questions or concerns that you may have regarding the information given to you following your procedure. If we do not reach you, we will leave a message.     If any biopsies were taken you will be contacted by phone or by letter within the next 1-3 weeks.  Please call us at [phone] if you have not heard about the biopsies in 3 weeks.    SIGNATURES/CONFIDENTIALITY: You and/or your care partner have signed paperwork which will be entered into your electronic medical record.  These signatures attest to the fact that that the information above on your After Visit Summary has been reviewed and is understood.  Full responsibility of the confidentiality of this discharge information lies with you and/or your care-partner.  

## 2023-03-04 NOTE — Op Note (Signed)
[facility] Patient Name: Tamara Webb Procedure Date: 03/04/2023 3:37 PM MRN: [id number] Endoscopist: Doristine Locks , MD, [id number] Age: 67 Referring MD:  Date of Birth: 1956-04-11 Gender: Female Account #: [id number] Procedure:                Colonoscopy Indications:              Surveillance: Personal history of adenomatous                            polyps on last colonoscopy > 5 years ago                           Last colonoscopy was 06/2012 and notable for                            redundant colon, 7 mm sigmoid hyperplastic polyp,                            5-9 mm flat cecal adenoma, 3 cm cecal adenoma                            removed with hot snare, with recommendation for                            short interval repeat colonoscopy. No colonoscopy                            since then. Otherwise no GI symptoms. Medicines:                Monitored Anesthesia Care Procedure:                Pre-Anesthesia Assessment:                           - Prior to the procedure, a History and Physical                            was performed, and patient medications and                            allergies were reviewed. The patient's tolerance of                            previous anesthesia was also reviewed. The risks                            and benefits of the procedure and the sedation                            options and risks were discussed with the patient.                            All questions were answered, and informed consent  was obtained. Prior Anticoagulants: The patient has                            taken no anticoagulant or antiplatelet agents. ASA                            Grade Assessment: II - A patient with mild systemic                            disease. After reviewing the risks and benefits,                            the patient was deemed in satisfactory condition to                            undergo the  procedure.                           After obtaining informed consent, the colonoscope                            was passed under direct vision. Throughout the                            procedure, the patient's blood pressure, pulse, and                            oxygen saturations were monitored continuously. The                            Olympus PCF-[id number]) Colonoscope was                            introduced through the anus and advanced to the the                            cecum, identified by appendiceal orifice and                            ileocecal valve. The colonoscopy was performed                            without difficulty. The patient tolerated the                            procedure well. The quality of the bowel                            preparation was good. The ileocecal valve,                            appendiceal orifice, and rectum were photographed. Scope In: 3:47:47 PM Scope Out: 4:14:39 PM Scope Withdrawal Time: 0 hours 22 minutes 37 seconds  Total Procedure Duration: 0 hours  26 minutes 52 seconds  Findings:                 The perianal and digital rectal examinations were                            normal.                           Three sessile polyps were found in the ascending                            colon and cecum. The polyps were 3 to 5 mm in size.                            These polyps were removed with a cold snare.                            Resection and retrieval were complete. Estimated                            blood loss was minimal.                           A 12 mm polyp was found in the descending colon.                            The polyp was flat. The polyp was removed with a                            cold snare. Resection and retrieval were complete.                            Estimated blood loss was minimal.                           A 4 mm polyp was found in the sigmoid colon. The                             polyp was sessile. The polyp was removed with a                            cold snare. Resection and retrieval were complete.                            Estimated blood loss was minimal.                           The retroflexed view of the distal rectum and anal                            verge was normal and showed no anal or rectal  abnormalities. Complications:            No immediate complications. Estimated Blood Loss:     Estimated blood loss was minimal. Impression:               - Three 3 to 5 mm polyps in the ascending colon and                            in the cecum, removed with a cold snare. Resected                            and retrieved.                           - One 12 mm polyp in the descending colon, removed                            with a cold snare. Resected and retrieved.                           - One 4 mm polyp in the sigmoid colon, removed with                            a cold snare. Resected and retrieved.                           - The distal rectum and anal verge are normal on                            retroflexion view. Recommendation:           - Patient has a contact number available for                            emergencies. The signs and symptoms of potential                            delayed complications were discussed with the                            patient. Return to normal activities tomorrow.                            Written discharge instructions were provided to the                            patient.                           - Resume previous diet.                           - Continue present medications.                           - Await pathology results.                           -  Repeat colonoscopy for surveillance based on                            pathology results.                           - Return to GI office PRN. Doristine Locks, MD 03/04/2023 4:21:38 PM

## 2023-03-04 NOTE — Progress Notes (Signed)
Vitals-Eric  Pt's states no medical or surgical changes since previsit or office visit. 

## 2023-03-04 NOTE — Progress Notes (Signed)
GASTROENTEROLOGY PROCEDURE H&P NOTE   Primary Care Physician: Jethro Bastos, MD    Reason for Procedure:  Colon Polyp surveillance  Plan:    Colonoscopy  Patient is appropriate for endoscopic procedure(s) in the ambulatory ([facility]) setting.  The nature of the procedure, as well as the risks, benefits, and alternatives were carefully and thoroughly reviewed with the patient. Ample time for discussion and questions allowed. The patient understood, was satisfied, and agreed to proceed.     HPI: Tamara Webb is a 67 y.o. female who presents for colonoscopy for ongoing polyp surveillance.  No active GI symptoms.   Last colonoscopy was 06/2012 and notable for a redundant colon, 7 mm sigmoid hyperplastic polyp, flat 5-9 mm cecal adenoma, and a 3 cm cecal adenoma removed with hot snare.    Past Medical History:  Diagnosis Date   Allergy    Breast cancer (HCC)    bilateral    Cancer [facility]) 1999&2006   breast cancer x2    COPD (chronic obstructive pulmonary disease) (HCC)    Coronary artery disease    Family history of breast cancer    Heart murmur 1999   Hyperlipidemia    Hypertension    Neuromuscular disorder (HCC)    Neuropathy    Neuropathy due to chemotherapeutic drug (HCC)    breast cancer , Dr Donnie Coffin.     Past Surgical History:  Procedure Laterality Date   APPENDECTOMY     BREAST RECONSTRUCTION     CARPAL TUNNEL RELEASE     right   ECTOPIC PREGNANCY SURGERY     LYMPH GLAND EXCISION     left arm   MASTECTOMY  09/08/2004   bil.mastectomy with abdominal trans flap.   OVARY SURGERY     after ectopic sx   TONSILLECTOMY  as child   TOTAL ABDOMINAL HYSTERECTOMY  09/09/1999    Prior to Admission medications   Medication Sig Start Date End Date Taking? Authorizing Provider  amLODipine (NORVASC) 5 MG tablet Take 1 tablet (5 mg total) by mouth daily. 05/15/22 05/10/23  O'NealRonnald Ramp, MD  aspirin EC 81 MG tablet Take 81 mg by mouth daily. Swallow whole.     [provider]  atorvastatin (LIPITOR) 20 MG tablet Take 1 tablet (20 mg total) by mouth daily. Patient not taking: Reported on 02/12/2023 05/15/22 05/10/23  Sande Rives, MD  Cholecalciferol (VITAMIN D PO) Take 5,000 Units by mouth daily.    [provider]  cyclobenzaprine (FLEXERIL) 10 MG tablet Take 10 mg by mouth 3 (three) times daily as needed for muscle spasms.    [provider]  diphenhydrAMINE (BENADRYL) 25 mg capsule Take 25 mg by mouth every 6 (six) hours as needed.    [provider]  gabapentin (NEURONTIN) 600 MG tablet Take 1 tablet (600 mg total) by mouth 3 (three) times daily. Patient taking differently: Take 600 mg by mouth QID. 03/02/14   Nilda Riggs, NP  morphine (KADIAN) 60 MG 24 hr capsule Take 60 mg by mouth 2 (two) times daily.    [provider]  Multiple Vitamin (MULTIVITAMIN) tablet Take 1 tablet by mouth daily.    [provider]  nitroGLYCERIN (NITROSTAT) 0.4 MG SL tablet DISSOLVE ONE TABLET UNDER THE TONGUE EVERY 5 MINUTES AS NEEDED FOR CHEST PAIN.  DO NOT EXCEED A TOTAL OF 3 DOSES IN 15 MINUTES 01/21/23   O'Neal, Ronnald Ramp, MD  oxyCODONE (OXY IR/ROXICODONE) 5 MG immediate release tablet Take 5  mg by mouth 3 (three) times daily as needed. 06/05/21   [provider]    Current Outpatient Medications  Medication Sig Dispense Refill   amLODipine (NORVASC) 5 MG tablet Take 1 tablet (5 mg total) by mouth daily. 90 tablet 3   aspirin EC 81 MG tablet Take 81 mg by mouth daily. Swallow whole.     atorvastatin (LIPITOR) 20 MG tablet Take 1 tablet (20 mg total) by mouth daily. (Patient not taking: Reported on 02/12/2023) 90 tablet 3   Cholecalciferol (VITAMIN D PO) Take 5,000 Units by mouth daily.     cyclobenzaprine (FLEXERIL) 10 MG tablet Take 10 mg by mouth 3 (three) times daily as needed for muscle spasms.     diphenhydrAMINE (BENADRYL) 25 mg capsule Take 25 mg by mouth every 6 (six) hours as  needed.     gabapentin (NEURONTIN) 600 MG tablet Take 1 tablet (600 mg total) by mouth 3 (three) times daily. (Patient taking differently: Take 600 mg by mouth QID.) 270 tablet 3   morphine (KADIAN) 60 MG 24 hr capsule Take 60 mg by mouth 2 (two) times daily.     Multiple Vitamin (MULTIVITAMIN) tablet Take 1 tablet by mouth daily.     nitroGLYCERIN (NITROSTAT) 0.4 MG SL tablet DISSOLVE ONE TABLET UNDER THE TONGUE EVERY 5 MINUTES AS NEEDED FOR CHEST PAIN.  DO NOT EXCEED A TOTAL OF 3 DOSES IN 15 MINUTES 25 tablet 3   oxyCODONE (OXY IR/ROXICODONE) 5 MG immediate release tablet Take 5 mg by mouth 3 (three) times daily as needed.     No current facility-administered medications for this visit.    Allergies as of 03/04/2023 - Review Complete 03/04/2023  Allergen Reaction Noted   Gadolinium derivatives Shortness Of Breath and Swelling 06/07/2012   Penicillins Shortness Of Breath 03/30/2012   Pepto-bismol [bismuth subsalicylate] Swelling 06/07/2012    Family History  Problem Relation Age of Onset   Diabetes Mother    Multiple sclerosis Mother    Heart attack Mother    Stroke Mother    Diabetes Father    Heart attack Father    Cancer Father        Kidney cancer   Colon polyps Sister 42   Diabetes Sister    Breast cancer Paternal Aunt        dx in her 70s   Dementia Maternal Grandmother    Heart attack Maternal Grandfather    Pulmonary embolism Paternal Grandmother    Cancer Paternal Grandfather        unknown type   Breast cancer Cousin        paternal first cousin dx under 50   Colon cancer Neg Hx    Esophageal cancer Neg Hx    Rectal cancer Neg Hx    Stomach cancer Neg Hx     Social History   Socioeconomic History   Marital status: Married    Spouse name: Merchant navy officer    Number of children: 0   Years of education: 12+   Highest education level: Not on file  Occupational History    Employer: UNEMPLOYED   Occupation: Disabled  Tobacco Use   Smoking status: Every Day     Packs/day: 1.00    Years: 36.00    Additional pack years: 0.00    Total pack years: 36.00    Types: Cigarettes   Smokeless tobacco: Never   Tobacco comments:    quit for 20 years,just started back in 2000  Substance and Sexual Activity  Alcohol use: No    Alcohol/week: 0.0 standard drinks of alcohol   Drug use: No   Sexual activity: Yes    Birth control/protection: Surgical  Other Topics Concern   Not on file  Social History Narrative   Patient lives with Loraine Leriche her husband.    Patient has no children.    Patient is currently not working.    Patient has some college.    Social Determinants of Health   Financial Resource Strain: Not on file  Food Insecurity: Not on file  Transportation Needs: Not on file  Physical Activity: Not on file  Stress: Not on file  Social Connections: Not on file  Intimate Partner Violence: Not on file    Physical Exam: Vital signs in last 24 hours: @BP  (!) 151/83 (BP Location: Right Arm, Patient Position: Sitting, Cuff Size: Normal)   Pulse 98   Temp 98.9 F (37.2 C) (Temporal)   Ht 5\' 5"  (1.651 m)   Wt 115 lb (52.2 kg)   SpO2 100%   BMI 19.14 kg/m  GEN: NAD EYE: Sclerae anicteric ENT: MMM CV: Non-tachycardic Pulm: CTA b/l GI: Soft, NT/ND NEURO:  Alert & Oriented x 3   Doristine Locks, DO [facility]   03/04/2023 2:30 PM

## 2023-03-05 ENCOUNTER — Telehealth: Payer: Self-pay

## 2023-03-05 NOTE — Telephone Encounter (Signed)
  Follow up Call-     03/04/2023    2:31 PM  Call back number  Post procedure Call Back phone  # [phone]  husband mark  Permission to leave phone message Yes     Patient questions:  Do you have a fever, pain , or abdominal swelling? No. Pain Score  0 *  Have you tolerated food without any problems? Yes.    Have you been able to return to your normal activities? Yes.    Do you have any questions about your discharge instructions: Diet   No. Medications  No. Follow up visit  No.  Do you have questions or concerns about your Care? No.  Actions: * If pain score is 4 or above: No action needed, pain <4.

## 2023-04-02 DIAGNOSIS — M4125 Other idiopathic scoliosis, thoracolumbar region: Secondary | ICD-10-CM | POA: Diagnosis not present

## 2023-04-02 DIAGNOSIS — G894 Chronic pain syndrome: Secondary | ICD-10-CM | POA: Diagnosis not present

## 2023-04-02 DIAGNOSIS — G620 Drug-induced polyneuropathy: Secondary | ICD-10-CM | POA: Diagnosis not present

## 2023-04-02 DIAGNOSIS — Z79891 Long term (current) use of opiate analgesic: Secondary | ICD-10-CM | POA: Diagnosis not present

## 2023-05-13 ENCOUNTER — Other Ambulatory Visit: Payer: Self-pay | Admitting: Cardiovascular Disease

## 2023-06-04 DIAGNOSIS — G894 Chronic pain syndrome: Secondary | ICD-10-CM | POA: Diagnosis not present

## 2023-06-04 DIAGNOSIS — Z79891 Long term (current) use of opiate analgesic: Secondary | ICD-10-CM | POA: Diagnosis not present

## 2023-06-04 DIAGNOSIS — M4125 Other idiopathic scoliosis, thoracolumbar region: Secondary | ICD-10-CM | POA: Diagnosis not present

## 2023-06-04 DIAGNOSIS — G620 Drug-induced polyneuropathy: Secondary | ICD-10-CM | POA: Diagnosis not present

## 2023-06-07 ENCOUNTER — Other Ambulatory Visit: Payer: Self-pay | Admitting: Cardiovascular Disease

## 2023-06-15 ENCOUNTER — Telehealth: Payer: Self-pay | Admitting: Cardiovascular Disease

## 2023-06-15 NOTE — Telephone Encounter (Signed)
*  STAT* If patient is at the pharmacy, call can be transferred to refill team.   1. Which medications need to be refilled? (please list name of each medication and dose if known)   amLODipine (NORVASC) 5 MG tablet   2. Would you like to learn more about the convenience, safety, & potential cost savings by using the [facility]?   3. Are you open to using the [facility] (Type [facility]. ).  4. Which pharmacy/location (including street and city if local pharmacy) is medication to be sent to?  [facility] [address] [place] - [address]   5. Do they need a 30 day or 90 day supply?  90 day  Patient stated she has 14 days left of this medication.  Patient has appointment scheduled on 12/5.

## 2023-06-16 MED ORDER — AMLODIPINE BESYLATE 5 MG PO TABS: 5.0000 mg | ORAL_TABLET | Freq: Every day | ORAL | 0 refills | Status: DC

## 2023-06-16 NOTE — Telephone Encounter (Signed)
Refills has been sent to the pharmacy. 

## 2023-08-05 DIAGNOSIS — Z79891 Long term (current) use of opiate analgesic: Secondary | ICD-10-CM | POA: Diagnosis not present

## 2023-08-05 DIAGNOSIS — G620 Drug-induced polyneuropathy: Secondary | ICD-10-CM | POA: Diagnosis not present

## 2023-08-05 DIAGNOSIS — G894 Chronic pain syndrome: Secondary | ICD-10-CM | POA: Diagnosis not present

## 2023-08-05 DIAGNOSIS — M4125 Other idiopathic scoliosis, thoracolumbar region: Secondary | ICD-10-CM | POA: Diagnosis not present

## 2023-08-12 NOTE — Progress Notes (Signed)
Cardiology Office Note:  .   Date:  08/13/2023  ID:  Tamara Webb, DOB 30-May-1956, MRN [id number] PCP: Jethro Bastos, MD  [facility] Providers Cardiologist:  None { History of Present Illness: .   Tamara Webb is a 67 y.o. female with history of CAD, HLD who presents for follow-up.    History of Present Illness   Tamara Webb, a 67 year old female with a history of non-constructive CAD on coronary CTA, hyperlipidemia, and tobacco abuse, presents for a follow-up visit due to her very elevated LDL cholesterol. She has been intolerant to statins and has been reluctant to pursue PCSK9 inhibitor therapy. Despite her high cholesterol, she continues to smoke, albeit less than a pack a day. She is aware of the risks, as her brother died of a heart attack, and heart disease runs in her family. She has tried to manage her cholesterol through diet, but acknowledges that her cholesterol is genetically high. She also reports occasional heart flutters, which she describes as rare and infrequent. She has not had her cholesterol checked since last September. Chest pain with laying on left chest related to prior breast CA surgery.          Problem List Breast CA -S/p chemo/Rx/lumpectomy 1999 -recurrence 2006 2. Tobacco abuse  3. CAD -severe mLAD stenosis (CT FFR 0.86) -lung CA screening CT 02/12/2021 4. HLD -T chol 347, HDL 46, LDL 284, TG 102 5. HTN    ROS: All other ROS reviewed and negative. Pertinent positives noted in the HPI.     Studies Reviewed: Marland Kitchen   EKG Interpretation Date/Time:  Thursday August 13 2023 13:48:09 EST Ventricular Rate:  73 PR Interval:  140 QRS Duration:  94 QT Interval:  380 QTC Calculation: 418 R Axis:   45  Text Interpretation: Normal sinus rhythm Possible Left atrial enlargement Minimal voltage criteria for LVH, may be normal variant ( Cornell product ) Confirmed by Lennie Odor [phone]) on 08/13/2023 1:52:20 PM   Physical Exam:   VS:  BP 120/70  (BP Location: Right Arm, Patient Position: Sitting, Cuff Size: Normal)   Pulse 78   Ht 5\' 5"  (1.651 m)   Wt 113 lb (51.3 kg)   SpO2 97%   BMI 18.80 kg/m    Wt Readings from Last 3 Encounters:  08/13/23 113 lb (51.3 kg)  03/04/23 115 lb (52.2 kg)  02/12/23 115 lb (52.2 kg)    GEN: Well nourished, well developed in no acute distress NECK: No JVD; No carotid bruits CARDIAC: RRR, no murmurs, rubs, gallops RESPIRATORY:  Clear to auscultation without rales, wheezing or rhonchi  ABDOMEN: Soft, non-tender, non-distended EXTREMITIES:  No edema; No deformity  ASSESSMENT AND PLAN: .   Assessment and Plan    Nonobstructive CAD Based on coronary CTA and FFRCT. Hyperlipidemia Very high LDL cholesterol. LDL 284. No children so we have opted against genetic testing.  -Statin intolerant. Discussed the need for PCSK9 inhibitor therapy due to familial hypercholesterolemia and high risk for CAD. Patient agreed to consider injectable therapy. -she will also get back on zetia 10 mg daily  -Refer to pharmacy lipid clinic for PCSK9 inhibitor therapy. -Check lipids and LPA today. -on norvasc  Tobacco Use Smoking 4-5 cigarettes per day. Discussed the importance of smoking cessation. -3 min smoking cessation counseling provided in office.  Non-Cardiac Chest Discomfort Reports discomfort when laying on left side, likely related to previous breast surgery. No cardiac symptoms reported. -Monitor symptoms.  Occasional Heart Flutters Rare and  infrequent. Likely benign but will monitor. -Advise patient to report if frequency or duration increases.  Hypertension Well controlled on amlodipine. -Continue amlodipine.  Follow-up In one year, but sooner with pharmacy for PCSK9 inhibitor therapy.              Follow-up: Return in about 1 year (around 08/12/2024).  Time Spent with Patient: I have spent a total of 35 minutes caring for this patient today face to face, ordering and reviewing labs/tests,  reviewing prior records/medical history, examining the patient, establishing an assessment and plan, communicating results/findings to the patient/family, and documenting in the medical record.   Signed, Lenna Gilford. Flora Lipps, MD, [facility]  [facility]  [address] [place], [place] [address] [phone]  2:11 PM

## 2023-08-13 ENCOUNTER — Encounter: Payer: Self-pay | Admitting: Cardiovascular Disease

## 2023-08-13 ENCOUNTER — Ambulatory Visit: Payer: Medicare Other | Attending: Cardiovascular Disease | Admitting: Cardiovascular Disease

## 2023-08-13 VITALS — BP 120/70 | HR 78 | Ht 65.0 in | Wt 113.0 lb

## 2023-08-13 DIAGNOSIS — E782 Mixed hyperlipidemia: Secondary | ICD-10-CM | POA: Insufficient documentation

## 2023-08-13 DIAGNOSIS — I10 Essential (primary) hypertension: Secondary | ICD-10-CM | POA: Diagnosis not present

## 2023-08-13 DIAGNOSIS — F17210 Nicotine dependence, cigarettes, uncomplicated: Secondary | ICD-10-CM | POA: Diagnosis not present

## 2023-08-13 DIAGNOSIS — I2510 Atherosclerotic heart disease of native coronary artery without angina pectoris: Secondary | ICD-10-CM | POA: Insufficient documentation

## 2023-08-13 DIAGNOSIS — Z720 Tobacco use: Secondary | ICD-10-CM | POA: Insufficient documentation

## 2023-08-13 NOTE — Patient Instructions (Signed)
Medication Instructions:  Your physician recommends that you continue on your current medications as directed. Please refer to the Current Medication list given to you today.    *If you need a refill on your cardiac medications before your next appointment, please call your pharmacy*   Lab Work: LIPIDS  Lpa    If you have labs (blood work) drawn today and your tests are completely normal, you will receive your results only by: MyChart Message (if you have MyChart) OR A paper copy in the mail If you have any lab test that is abnormal or we need to change your treatment, we will call you to review the results.   Testing/Procedures: None   Follow-Up: At [facility], you and your health needs are our priority.  As part of our continuing mission to provide you with exceptional heart care, we have created designated Provider Care Teams.  These Care Teams include your primary Cardiologist (physician) and Advanced Practice Providers (APPs -  Physician Assistants and Nurse Practitioners) who all work together to provide you with the care you need, when you need it.  We recommend signing up for the patient portal called "MyChart".  Sign up information is provided on this After Visit Summary.  MyChart is used to connect with patients for Virtual Visits (Telemedicine).  Patients are able to view lab/test results, encounter notes, upcoming appointments, etc.  Non-urgent messages can be sent to your provider as well.   To learn more about what you can do with MyChart, go to [email or web address].    Your next appointment:   1 year(s)  The format for your next appointment:   In Person  Provider:   Lennie Odor, MD   Other Instructions Dr. Flora Lipps has referred you to our Lipid Clinic for consultation regarding your cholesterol.

## 2023-08-15 LAB — LIPOPROTEIN A (LPA): Lipoprotein (a): 204.9 nmol/L — ABNORMAL HIGH (ref <75.0)

## 2023-08-15 LAB — LIPID PANEL
Chol/HDL Ratio: 6.6 {ratio} — ABNORMAL HIGH (ref 0.0–4.4)
Cholesterol, Total: 352 mg/dL — ABNORMAL HIGH (ref 100–199)
HDL: 53 mg/dL (ref >39)
LDL Chol Calc (NIH): 278 mg/dL — ABNORMAL HIGH (ref 0–99)
Triglycerides: 119 mg/dL (ref 0–149)
VLDL Cholesterol Cal: 21 mg/dL (ref 5–40)

## 2023-08-27 ENCOUNTER — Ambulatory Visit: Payer: Medicare Other | Attending: Internal Medicine | Admitting: Student

## 2023-08-27 ENCOUNTER — Encounter: Payer: Self-pay | Admitting: Student

## 2023-08-27 DIAGNOSIS — E7849 Other hyperlipidemia: Secondary | ICD-10-CM | POA: Insufficient documentation

## 2023-08-27 DIAGNOSIS — E785 Hyperlipidemia, unspecified: Secondary | ICD-10-CM | POA: Insufficient documentation

## 2023-08-27 MED ORDER — ROSUVASTATIN CALCIUM 10 MG PO TABS: 10.0000 mg | ORAL_TABLET | Freq: Every day | ORAL | 3 refills | Status: DC

## 2023-08-27 NOTE — Assessment & Plan Note (Addendum)
Assessment:  LDL goal: < 70 mg/dl last LDLc 981  mg/dl  Currently not on any lipid lowering agent  Intolerance to statins- Lipitor 20 mg, 10 mg and 40 mg and Crestor 20 mg - muscle pain  Discussed statins, Zetia and PCSK-9 inhibitors, in details; cost, dosing efficacy, side effects  Follow heart healthy diet and stays active around the house   Consider high LDLc level and risk factors pt is in agreement try Crestor 10 mg and will self adjust the dose until patient figure out max tolerated dose  Need to be on multiple agent to achieve LDLc goal   Plan: Start taking rosuvastatin 10 mg daily if not tolerated reduce dose to 5 mg daily or every other day will f/u in 2 weeks via phone. Start taking ezetimibe 10 mg daily.  Will apply for PA for PCSK9i early Jan as per patient's request; will inform patient upon approval (prefers MyChart message) Lipid lab due in 2-3 months in therapy optimization

## 2023-08-27 NOTE — Patient Instructions (Addendum)
Your Results:             Your most recent labs Goal  Total Cholesterol 352 < 200  Triglycerides 119 < 150  HDL (happy/good cholesterol) 53 > 40  LDL (lousy/bad cholesterol 278 < 70   Medication changes: Start taking rosuvastatin 10 mg daily if not tolerated reduce dose to 5 mg daily or every other day will f/u in 2 weeks via phone. Start taking ezetimibe 10 mg daily.  We will start the process to get PCSK9i ( Repatha or Praluent) covered by your insurance.  Once the prior authorization is complete, we will call you to let you know and confirm pharmacy information.     Praluent is a cholesterol medication that improved your body's ability to get rid of "bad cholesterol" known as LDL. It can lower your LDL up to 60%. It is an injection that is given under the skin every 2 weeks. The most common side effects of Praluent include runny nose, symptoms of the common cold, rarely flu or flu-like symptoms, back/muscle pain in about 3-4% of the patients, and redness, pain, or bruising at the injection site.    Repatha is a cholesterol medication that improved your body's ability to get rid of "bad cholesterol" known as LDL. It can lower your LDL up to 60%! It is an injection that is given under the skin every 2 weeks. The most common side effects of Repatha include runny nose, symptoms of the common cold, rarely flu or flu-like symptoms, back/muscle pain in about 3-4% of the patients, and redness, pain, or bruising at the injection site.   Lab orders: We want to repeat labs after 2-3 months.  We will send you a lab order to remind you once we get closer to that time.

## 2023-08-27 NOTE — Progress Notes (Signed)
Patient ID: Tamara Webb                 DOB: 19-Jul-1956                    MRN: [id number]      HPI: LINZEE OLESH is a 67 y.o. female patient referred to lipid clinic by Dr.OJennette Kettle. PMH is significant for history of CAD, HLD   Patient presented today reports she off of all cholesterol medications due to sever myalgia. The last lab her being off of the medications for some time. She has changed her diet significantly but that did not helped to change her LDLc numbers. She has strong family hx of ASCVD. Her record indicates she has tried Chief of Staff 20 mg April 2023. We discussed her current LDLc level, risk factors and LDLc lowering effect, primary prevention benefits of statins and patient is in agreement to try Crestor 10 mg along with Zetia we also reviewed other options for lowering LDL cholesterol,PCSK-9 inhibitors  Discussed mechanisms of action, dosing, side effects and potential decreases in LDL cholesterol.  Also reviewed cost information and potential options for patient assistance.   Patient want to wait till Jan 2nd to apply for PA for PCSK9i as she does not wan to affect her other medication cost. She thinks her going on brand name medication right near the end of the year it will rise other meds cost.    Current Medications:none   Intolerances: Lipitor 20 mg, 10 mg and 40 mg and Crestor 20 mg - muscle pain  Risk Factors: Coronary calcium score of 350 (95th percentile) CAD, HLD, family hx of premature ASCVD  LDL goal: <70 mg/dl  Last Lab: LDLc 086, TG 119 TC 352, HDL 53 Lp(a)204.9 Diet:eats healthy diet   Exercise: walk 3 times per week,stays active around the house   Family History:  Relation Problem Comments  Mother (Deceased) Diabetes   Heart attack 74 age   Multiple sclerosis   Stroke 93 age     Father (Deceased) Cancer Kidney cancer  Diabetes   Heart attack 55 yrs     Sister - Chief Operating Officer (Alive) Colon polyps (Age: 64)   Diabetes heart issue      Sister - Journalist, newspaper  (Alive)   Brother - Engineer, production (Alive)   Brother - Onalee Hua  Died from MI at age 77   Paternal Aunt - Alice (Deceased) Breast cancer dx in her 30s    Paternal Uncle - x4 (Deceased)   Maternal Grandmother (Deceased) Dementia     Maternal Grandfather (Deceased) Heart attack     Paternal Grandmother (Deceased) Pulmonary embolism      Social History:  Alcohol: none  Smoking: 1 pack per day working on cutting down  Labs: Lipid Panel     Component Value Date/Time   CHOL 352 (H) 08/13/2023 1425   TRIG 119 08/13/2023 1425   HDL 53 08/13/2023 1425   CHOLHDL 6.6 (H) 08/13/2023 1425   LDLCALC 278 (H) 08/13/2023 1425   LABVLDL 21 08/13/2023 1425    Past Medical History:  Diagnosis Date   Allergy    Breast cancer (HCC)    bilateral    Cancer (HCC) 1999&2006   breast cancer x2    COPD (chronic obstructive pulmonary disease) (HCC)    Coronary artery disease    Family history of breast cancer    Heart murmur 1999   Hyperlipidemia    Hypertension    Neuromuscular disorder (HCC)  Neuropathy    Neuropathy due to chemotherapeutic drug [facility])    breast cancer , Dr Donnie Coffin.     Current Outpatient Medications on File Prior to Visit  Medication Sig Dispense Refill   amLODipine (NORVASC) 5 MG tablet Take 1 tablet (5 mg total) by mouth daily. KEEP OV. 90 tablet 0   aspirin EC 81 MG tablet Take 81 mg by mouth daily. Swallow whole.     Cholecalciferol (VITAMIN D PO) Take 5,000 Units by mouth daily.     cyclobenzaprine (FLEXERIL) 10 MG tablet Take 10 mg by mouth 3 (three) times daily as needed for muscle spasms.     diphenhydrAMINE (BENADRYL) 25 mg capsule Take 25 mg by mouth every 6 (six) hours as needed.     ezetimibe (ZETIA) 10 MG tablet Take 10 mg by mouth daily.     gabapentin (NEURONTIN) 600 MG tablet Take 1 tablet (600 mg total) by mouth 3 (three) times daily. (Patient taking differently: Take 600 mg by mouth QID.) 270 tablet 3   morphine (KADIAN) 60 MG 24 hr capsule Take 60 mg by mouth 2  (two) times daily.     Multiple Vitamin (MULTIVITAMIN) tablet Take 1 tablet by mouth daily.     nitroGLYCERIN (NITROSTAT) 0.4 MG SL tablet DISSOLVE ONE TABLET UNDER THE TONGUE EVERY 5 MINUTES AS NEEDED FOR CHEST PAIN.  DO NOT EXCEED A TOTAL OF 3 DOSES IN 15 MINUTES 25 tablet 3   oxyCODONE (OXY IR/ROXICODONE) 5 MG immediate release tablet Take 5 mg by mouth 3 (three) times daily as needed.     No current facility-administered medications on file prior to visit.    Allergies  Allergen Reactions   Gadolinium Derivatives Shortness Of Breath and Swelling    Given for xray   Penicillins Shortness Of Breath   Pepto-Bismol [Bismuth Subsalicylate] Swelling    Assessment/Plan:  1. Hyperlipidemia -  Problem  Hyperlipidemia   Current Medications: none  Intolerances: Lipitor 20 mg, 10 mg and 40 mg and Crestor 20 mg - muscle pain  Risk Factors: Coronary calcium score of 350 (95th percentile) CAD, HLD, family hx of premature ASCVD  LDL goal: <70 mg/dl  Last Lab: LDLc 540, TG 119 TC 352, HDL 53 Lp(a)204.9    Hyperlipidemia Assessment:  LDL goal: < 70 mg/dl last LDLc 981  mg/dl  Currently not on any lipid lowering agent  Intolerance to statins- Lipitor 20 mg, 10 mg and 40 mg and Crestor 20 mg - muscle pain  Discussed statins, Zetia and PCSK-9 inhibitors, in details; cost, dosing efficacy, side effects  Follow heart healthy diet and stays active around the house   Consider high LDLc level and risk factors pt is in agreement try Crestor 10 mg and will self adjust the dose until patient figure out max tolerated dose  Need to be on multiple agent to achieve LDLc goal   Plan: Start taking rosuvastatin 10 mg daily if not tolerated reduce dose to 5 mg daily or every other day will f/u in 2 weeks via phone. Start taking ezetimibe 10 mg daily.  Will apply for PA for PCSK9i early Jan as per patient's request; will inform patient upon approval (prefers MyChart message) Lipid lab due in 2-3 months in  therapy optimization     Thank you,  Carmela Hurt, Pharm.D [facility] A Division of [facility] [address] [place], [place] [address]  Phone: [phone]; Fax: [phone]

## 2023-09-08 ENCOUNTER — Other Ambulatory Visit: Payer: Self-pay | Admitting: Cardiovascular Disease

## 2023-10-06 ENCOUNTER — Other Ambulatory Visit (HOSPITAL_COMMUNITY): Payer: Self-pay

## 2023-10-06 ENCOUNTER — Telehealth: Payer: Self-pay | Admitting: Pharmacy Technician

## 2023-10-06 ENCOUNTER — Telehealth: Payer: Self-pay | Admitting: Pharmacist

## 2023-10-06 NOTE — Telephone Encounter (Signed)
PA request has been Submitted. New Encounter created for follow up. For additional info see Pharmacy Prior Auth telephone encounter from 10/06/23.

## 2023-10-06 NOTE — Telephone Encounter (Signed)
As per visit on Dec 19 f/u on Crestor tolerability due and initiating PA for PCSK9i.  Call to discuss with patient, N/A LVM to call back

## 2023-10-06 NOTE — Telephone Encounter (Signed)
Pharmacy Patient Advocate Encounter   Received notification from Pt Calls Messages that prior authorization for Repatha SureClick 140MG /ML auto-injectors is required/requested.   Insurance verification completed.   The patient is insured through [facility] .   Per test claim: PA required; PA submitted to above mentioned insurance via CoverMyMeds Key/confirmation #/EOC [facility] Status is pending

## 2023-10-07 ENCOUNTER — Telehealth: Payer: Self-pay | Admitting: Pharmacy Technician

## 2023-10-07 ENCOUNTER — Other Ambulatory Visit (HOSPITAL_COMMUNITY): Payer: Self-pay

## 2023-10-07 DIAGNOSIS — E782 Mixed hyperlipidemia: Secondary | ICD-10-CM

## 2023-10-07 NOTE — Telephone Encounter (Signed)
The patient was approved for a [employer] grant that will help cover the cost of REPATHA Total amount awarded, 2500.00.  Effective: 09/07/23 - 09/05/24   [id number] PCN: [id number] [id number]   I CALLED [facility] IN [place] HER PREFERRED PHARMACY

## 2023-10-07 NOTE — Telephone Encounter (Signed)
Pharmacy Patient Advocate Encounter  Received notification from [facility] that Prior Authorization for Repatha SureClick 140MG /ML auto-injectors  has been APPROVED from 10/06/23 to 10/05/24. Ran test claim, Copay is $535.42. This test claim was processed through [facility]- copay amounts may vary at other pharmacies due to pharmacy/plan contracts, or as the patient moves through the different stages of their insurance plan.  I WENT AHEAD AND GOT HER A GRANT SINCE HER COPAY WAS SO HIGH THIS TIME BECAUSE OF HER DEDUCTIBLE.   The patient was approved for a [employer] grant that will help cover the cost of REPATHA Total amount awarded, 2500.00.  Effective: 09/07/23 - 09/05/24   [id number] PCN: [id number] [id number] [id number]   I CALLED [facility] IN [place] HER PREFERRED PHARMACY AND GAVE THEM THIS GRANT INFORMATION.   PA #/Case ID/Reference #: [id number]

## 2023-10-08 MED ORDER — REPATHA SURECLICK 140 MG/ML ~~LOC~~ SOAJ: 140.0000 mg | SUBCUTANEOUS | 3 refills | Status: DC

## 2023-10-08 NOTE — Telephone Encounter (Signed)
Prescription sent to pharmacy. Spoke to patient. F/u LP due on April 25. Patient made aware.

## 2023-10-08 NOTE — Addendum Note (Signed)
Addended by: Tylene Fantasia on: 10/08/2023 12:33 PM   Modules accepted: Orders

## 2023-11-04 DIAGNOSIS — G620 Drug-induced polyneuropathy: Secondary | ICD-10-CM | POA: Diagnosis not present

## 2023-11-04 DIAGNOSIS — M4125 Other idiopathic scoliosis, thoracolumbar region: Secondary | ICD-10-CM | POA: Diagnosis not present

## 2023-11-04 DIAGNOSIS — G894 Chronic pain syndrome: Secondary | ICD-10-CM | POA: Diagnosis not present

## 2023-11-04 DIAGNOSIS — Z79891 Long term (current) use of opiate analgesic: Secondary | ICD-10-CM | POA: Diagnosis not present

## 2023-11-06 MED ORDER — REPATHA SURECLICK 140 MG/ML ~~LOC~~ SOAJ: 140.0000 mg | SUBCUTANEOUS | 3 refills | Status: AC

## 2023-11-06 NOTE — Addendum Note (Signed)
 Addended by: Malena Peer D on: 11/06/2023 04:50 PM   Modules accepted: Orders

## 2023-11-06 NOTE — Telephone Encounter (Signed)
 Patient called stating she had not heard anything about her healthwell grant.  Advised that she was approved and her pharmacy was given this information.  Patient has not picked up prescription.  At this point it has been about a month so I will call in a new prescription and I gave the patient the grant information so that she can give it to her pharmacy if needed.  She has an appointment at the end of April for labs.  As long as she gets the prescription next week she should have at least 4 injections before she goes for those labs.

## 2023-12-31 DIAGNOSIS — Z79891 Long term (current) use of opiate analgesic: Secondary | ICD-10-CM | POA: Diagnosis not present

## 2023-12-31 DIAGNOSIS — G894 Chronic pain syndrome: Secondary | ICD-10-CM | POA: Diagnosis not present

## 2023-12-31 DIAGNOSIS — G620 Drug-induced polyneuropathy: Secondary | ICD-10-CM | POA: Diagnosis not present

## 2023-12-31 DIAGNOSIS — M4125 Other idiopathic scoliosis, thoracolumbar region: Secondary | ICD-10-CM | POA: Diagnosis not present

## 2024-01-04 ENCOUNTER — Telehealth: Payer: Self-pay | Admitting: Pharmacist

## 2024-01-04 NOTE — Telephone Encounter (Signed)
 Repatha  started early March. Call to remind for lab. N/A, LVM .

## 2024-01-07 DIAGNOSIS — E782 Mixed hyperlipidemia: Secondary | ICD-10-CM | POA: Diagnosis not present

## 2024-01-08 ENCOUNTER — Telehealth: Payer: Self-pay | Admitting: Pharmacist

## 2024-01-08 DIAGNOSIS — E782 Mixed hyperlipidemia: Secondary | ICD-10-CM

## 2024-01-08 LAB — LIPID PANEL
Chol/HDL Ratio: 3.2 ratio (ref 0.0–4.4)
Cholesterol, Total: 184 mg/dL (ref 100–199)
HDL: 57 mg/dL (ref >39)
LDL Chol Calc (NIH): 109 mg/dL — ABNORMAL HIGH (ref 0–99)
Triglycerides: 97 mg/dL (ref 0–149)
VLDL Cholesterol Cal: 18 mg/dL (ref 5–40)

## 2024-01-08 NOTE — Telephone Encounter (Signed)
 Call to discuss lipid lab, LDL went down to 109 from 629 in 3-4 months after addition of Repatha  to max tolerated statin. Pt had stopped taking Zetia  when she started Repatha . LDLc still above the goal so will resume Zetia  back and will repeat lab in 3 months

## 2024-02-25 DIAGNOSIS — M4125 Other idiopathic scoliosis, thoracolumbar region: Secondary | ICD-10-CM | POA: Diagnosis not present

## 2024-02-25 DIAGNOSIS — G894 Chronic pain syndrome: Secondary | ICD-10-CM | POA: Diagnosis not present

## 2024-02-25 DIAGNOSIS — Z79891 Long term (current) use of opiate analgesic: Secondary | ICD-10-CM | POA: Diagnosis not present

## 2024-02-25 DIAGNOSIS — G620 Drug-induced polyneuropathy: Secondary | ICD-10-CM | POA: Diagnosis not present

## 2024-04-04 NOTE — Telephone Encounter (Signed)
 Call to remind pt about follow up lab. Will be going for lab on July 29.

## 2024-04-05 DIAGNOSIS — E782 Mixed hyperlipidemia: Secondary | ICD-10-CM | POA: Diagnosis not present

## 2024-04-06 ENCOUNTER — Ambulatory Visit: Payer: Self-pay | Admitting: Pharmacist

## 2024-04-06 LAB — LIPID PANEL
Chol/HDL Ratio: 2.3 ratio (ref 0.0–4.4)
Cholesterol, Total: 119 mg/dL (ref 100–199)
HDL: 51 mg/dL (ref >39)
LDL Chol Calc (NIH): 52 mg/dL (ref 0–99)
Triglycerides: 81 mg/dL (ref 0–149)
VLDL Cholesterol Cal: 16 mg/dL (ref 5–40)

## 2024-04-06 LAB — LIPOPROTEIN A (LPA): Lipoprotein (a): 224.3 nmol/L — ABNORMAL HIGH (ref <75.0)

## 2024-04-06 MED ORDER — EZETIMIBE 10 MG PO TABS: 10.0000 mg | ORAL_TABLET | ORAL | Status: DC

## 2024-04-06 NOTE — Telephone Encounter (Signed)
 LDL at goal. Pt reports she is not been able to tolerate Zetia  10 mg daily so only taking 2 times per week and Crestor  10 gm twice a week. She has been tolerating Repatha  ok. Advised to continue with current therapy as her LDL and TG are at goal.

## 2024-04-27 DIAGNOSIS — Z79891 Long term (current) use of opiate analgesic: Secondary | ICD-10-CM | POA: Diagnosis not present

## 2024-04-27 DIAGNOSIS — G894 Chronic pain syndrome: Secondary | ICD-10-CM | POA: Diagnosis not present

## 2024-04-27 DIAGNOSIS — M4125 Other idiopathic scoliosis, thoracolumbar region: Secondary | ICD-10-CM | POA: Diagnosis not present

## 2024-04-27 DIAGNOSIS — G620 Drug-induced polyneuropathy: Secondary | ICD-10-CM | POA: Diagnosis not present

## 2024-04-28 DIAGNOSIS — Z79891 Long term (current) use of opiate analgesic: Secondary | ICD-10-CM | POA: Diagnosis not present

## 2024-04-28 DIAGNOSIS — G894 Chronic pain syndrome: Secondary | ICD-10-CM | POA: Diagnosis not present

## 2024-06-01 ENCOUNTER — Other Ambulatory Visit: Payer: Self-pay

## 2024-06-01 MED ORDER — NITROGLYCERIN 0.4 MG SL SUBL: 0.4000 mg | SUBLINGUAL_TABLET | SUBLINGUAL | 3 refills | Status: AC | PRN

## 2024-06-29 DIAGNOSIS — G620 Drug-induced polyneuropathy: Secondary | ICD-10-CM | POA: Diagnosis not present

## 2024-06-29 DIAGNOSIS — G894 Chronic pain syndrome: Secondary | ICD-10-CM | POA: Diagnosis not present

## 2024-06-29 DIAGNOSIS — Z79891 Long term (current) use of opiate analgesic: Secondary | ICD-10-CM | POA: Diagnosis not present

## 2024-06-29 DIAGNOSIS — M4125 Other idiopathic scoliosis, thoracolumbar region: Secondary | ICD-10-CM | POA: Diagnosis not present

## 2024-07-18 DIAGNOSIS — Z85828 Personal history of other malignant neoplasm of skin: Secondary | ICD-10-CM | POA: Diagnosis not present

## 2024-07-18 DIAGNOSIS — D225 Melanocytic nevi of trunk: Secondary | ICD-10-CM | POA: Diagnosis not present

## 2024-07-18 DIAGNOSIS — L814 Other melanin hyperpigmentation: Secondary | ICD-10-CM | POA: Diagnosis not present

## 2024-07-18 DIAGNOSIS — L821 Other seborrheic keratosis: Secondary | ICD-10-CM | POA: Diagnosis not present

## 2024-08-10 NOTE — Progress Notes (Unsigned)
 Cardiology Office Note:  .   Date:  08/11/2024  ID:  Tamara Webb, DOB Aug 12, 1956, MRN [id number] PCP: Alona Lamar SAILOR, MD (Inactive)  [facility] Providers Cardiologist:  Darryle ONEIDA Decent, MD   History of Present Illness: .    Chief Complaint  Patient presents with   Chest Pain    History of Present Illness   Tamara Webb is a 68 year old female with coronary artery disease who presents for follow-up of chest discomfort and leg pain.  She experiences chest discomfort primarily at night, described as a combination of sharp and dull pain, occurring a couple of times a week. The discomfort is not associated with physical activity and mainly occurs when she is lying down. Episodes last for minutes and sometimes resolve spontaneously, but she finds relief with nitroglycerin .  She has a history of coronary artery disease with moderate to severe mid LAD stenosis, which was negative by CT FFR. She is currently on Repatha  for hyperlipidemia, with an LDL level of 52. She reports muscle pain similar to what she experienced with statins, which she associates with Repatha , although it is not constant. She takes Repatha  every two weeks.  She experiences leg pain, particularly in her legs, which she describes as worsening. She reports frequent 'Charlie horses' in her toes and feet, and has a history of sciatica affecting her hip. She remains active but does not engage in regular exercise. She believes that staying active helps alleviate her symptoms more than resting.  She has a history of hypertension and is currently taking amlodipine  5 mg daily. Her blood pressure at home is not regularly monitored.  She has a history of tobacco use and currently smokes less than a pack a day. She has reduced her smoking and notes that her husband has also cut back, which she believes will help her quit eventually.      Tamara Webb is a 68 y.o. female with history of CAD, HTN, HLD who presents for  follow-up.      Problem List Breast CA -S/p chemo/Rx/lumpectomy 1999 -recurrence 2006 2. Tobacco abuse  3. CAD -severe mLAD stenosis (CT FFR 0.86) 4. HLD -T chol 119, HDL 51, LDL 52, TG 81 -Lpa 224 5. HTN    ROS: All other ROS reviewed and negative. Pertinent positives noted in the HPI.     Studies Reviewed: SABRA   EKG Interpretation Date/Time:  Thursday August 11 2024 13:20:57 EST Ventricular Rate:  82 PR Interval:  140 QRS Duration:  88 QT Interval:  366 QTC Calculation: 427 R Axis:   85  Text Interpretation: Normal sinus rhythm Possible Left atrial enlargement Confirmed by Decent Darryle [phone]) on 08/11/2024 1:34:30 PM   Physical Exam:   VS:  BP 135/77   Pulse 82   Ht 5' 5 (1.651 m)   Wt 110 lb (49.9 kg)   SpO2 99%   BMI 18.30 kg/m    Wt Readings from Last 3 Encounters:  08/11/24 110 lb (49.9 kg)  08/13/23 113 lb (51.3 kg)  03/04/23 115 lb (52.2 kg)    GEN: Well nourished, well developed in no acute distress NECK: No JVD; No carotid bruits CARDIAC: RRR, no murmurs, rubs, gallops RESPIRATORY:  Clear to auscultation without rales, wheezing or rhonchi  ABDOMEN: Soft, non-tender, non-distended EXTREMITIES:  absent DP pulses on exam today ASSESSMENT AND PLAN: .   Assessment and Plan    Coronary artery disease with angina Intermittent nocturnal chest discomfort, improved with  nitroglycerin . EKG shows sinus rhythm, no acute ischemic changes. Moderate to severe LAD lesion negative by CT FFR in 2021. Discussed potential heart catheterization and stenting. She prefers to wait a few months. - Started Imdur  30 mg daily. - Continue aspirin 81 mg daily. - Continue nitroglycerin  as needed. - Continue amlodipine  5 mg daily. - Will discuss heart catheterization in 4 months. She wishes to wait for now.   Peripheral artery disease with claudication Leg cramping, absent DP pulses, good PT pulses. Symptoms suggest claudication. Differential includes neuropathy and  sciatica. - Ordered ABIs to assess for peripheral artery disease.  Mixed hyperlipidemia On Repatha  with LDL at 52. Reports muscle pain potentially related to medication. Elevated LPA noted. Previously on rosuvastatin  and Zetia , which caused muscle pain. - Discontinued rosuvastatin  and Zetia . - Continue Repatha .  Primary hypertension Blood pressure controlled at 135/70. No home monitoring reported. - Continue current antihypertensive regimen.  Tobacco use disorder Currently smoking half a pack per day. Discussed smoking's impact on CAD. She has reduced smoking and is considering further reduction. - Provided 3 min smoking cessation counseling today.  - Encouraged further reduction in smoking.              Follow-up: Return in about 3 months (around 11/09/2024).  Signed, Darryle DASEN. Barbaraann, MD, [facility]  [facility] [place]  [address] [place], [place] [address] [phone]  1:52 PM

## 2024-08-11 ENCOUNTER — Ambulatory Visit: Attending: Cardiovascular Disease | Admitting: Cardiovascular Disease

## 2024-08-11 ENCOUNTER — Encounter: Payer: Self-pay | Admitting: Cardiovascular Disease

## 2024-08-11 VITALS — BP 135/77 | HR 82 | Ht 65.0 in | Wt 110.0 lb

## 2024-08-11 DIAGNOSIS — I2510 Atherosclerotic heart disease of native coronary artery without angina pectoris: Secondary | ICD-10-CM | POA: Insufficient documentation

## 2024-08-11 DIAGNOSIS — E782 Mixed hyperlipidemia: Secondary | ICD-10-CM | POA: Insufficient documentation

## 2024-08-11 DIAGNOSIS — I739 Peripheral vascular disease, unspecified: Secondary | ICD-10-CM | POA: Diagnosis not present

## 2024-08-11 DIAGNOSIS — I10 Essential (primary) hypertension: Secondary | ICD-10-CM | POA: Insufficient documentation

## 2024-08-11 DIAGNOSIS — Z720 Tobacco use: Secondary | ICD-10-CM | POA: Diagnosis not present

## 2024-08-11 MED ORDER — ISOSORBIDE MONONITRATE ER 30 MG PO TB24: 30.0000 mg | ORAL_TABLET | Freq: Every day | ORAL | 3 refills | Status: AC

## 2024-08-11 NOTE — Patient Instructions (Signed)
 Medication Instructions:  Your physician has recommended you make the following change in your medication:   -Stop taking rosuvastatin  (crestor ).  -Stop taking ezetimibe  (zetia ).  -Start taking isosorbide mononitrate (Imdur) 30mg  once daily.  *If you need a refill on your cardiac medications before your next appointment, please call your pharmacy*   Testing/Procedures: Your physician has requested that you have a lower extremity arterial duplex. During this test, ultrasound is used to evaluate arterial blood flow in the legs. Allow one hour for this exam. There are no restrictions or special instructions. This will take place at [address] 4th floor   Please note: We ask at that you not bring children with you during ultrasound (echo/ vascular) testing. Due to room size and safety concerns, children are not allowed in the ultrasound rooms during exams. Our front office staff cannot provide observation of children in our lobby area while testing is being conducted. An adult accompanying a patient to their appointment will only be allowed in the ultrasound room at the discretion of the ultrasound technician under special circumstances. We apologize for any inconvenience.   Your physician has requested that you have an ankle brachial index (ABI). During this test an ultrasound and blood pressure cuff are used to evaluate the arteries that supply the arms and legs with blood. Allow thirty minutes for this exam. There are no restrictions or special instructions. This will take place at [address] 4th floor   Please note: We ask at that you not bring children with you during ultrasound (echo/ vascular) testing. Due to room size and safety concerns, children are not allowed in the ultrasound rooms during exams. Our front office staff cannot provide observation of children in our lobby area while testing is being conducted. An adult accompanying a patient to their appointment will only be  allowed in the ultrasound room at the discretion of the ultrasound technician under special circumstances. We apologize for any inconvenience.   Follow-Up: At [facility], you and your health needs are our priority.  As part of our continuing mission to provide you with exceptional heart care, our providers are all part of one team.  This team includes your primary Cardiologist (physician) and Advanced Practice Providers or APPs (Physician Assistants and Nurse Practitioners) who all work together to provide you with the care you need, when you need it.  Your next appointment:   3 month(s)  Provider:   Darryle ONEIDA Decent, MD    We recommend signing up for the patient portal called MyChart.  Sign up information is provided on this After Visit Summary.  MyChart is used to connect with patients for Virtual Visits (Telemedicine).  Patients are able to view lab/test results, encounter notes, upcoming appointments, etc.  Non-urgent messages can be sent to your provider as well.   To learn more about what you can do with MyChart, go to [email or web address].   Other Instructions

## 2024-09-14 ENCOUNTER — Other Ambulatory Visit: Payer: Self-pay | Admitting: Cardiovascular Disease

## 2024-10-04 ENCOUNTER — Telehealth: Payer: Self-pay | Admitting: Cardiovascular Disease

## 2024-10-04 NOTE — Telephone Encounter (Signed)
 Spoke with patient. She would like to proceed with scheduling LHC, as discussed at last visit 08/11/24. Advised she may need a clinic visit for updated H&P. Advised will send message to MD to review. She prefers a call back in the afternoon.

## 2024-10-04 NOTE — Telephone Encounter (Signed)
 Pt states medication is not being covered by insurance anymore. Pt looking for different option.    Evolocumab  (REPATHA  SURECLICK) 140 MG/ML SOAJ

## 2024-10-04 NOTE — Telephone Encounter (Signed)
Routed to PA team for assistance.

## 2024-10-04 NOTE — Telephone Encounter (Signed)
 Pt does not remember test name but wants to know if she can get it done. Please advise.

## 2024-10-05 ENCOUNTER — Other Ambulatory Visit (HOSPITAL_COMMUNITY): Payer: Self-pay

## 2024-10-05 ENCOUNTER — Telehealth: Payer: Self-pay | Admitting: Pharmacy Technician

## 2024-10-05 NOTE — Telephone Encounter (Signed)
 PRIOR AUTHORIZATION EXPIRES ON 09/07/25 for repatha   Patient Advocate Encounter   The patient was approved for a [employer] grant that will help cover the cost of REPATHA  Total amount awarded, 2500.  Effective: 09/06/24 - 09/05/25   [id number] ERW:[id number] Hmnle:[id number] [id number] [employer] ID: [id number]   Pharmacy provided with approval and processing information. Patient informed via telephone -lmom

## 2024-10-06 ENCOUNTER — Ambulatory Visit (HOSPITAL_COMMUNITY)

## 2024-10-06 NOTE — Telephone Encounter (Signed)
 Left message for patient to call back to schedule an appointment in order to arrange testing.

## 2024-10-11 NOTE — Telephone Encounter (Signed)
 Left message for patient to call back

## 2024-10-27 ENCOUNTER — Ambulatory Visit (HOSPITAL_COMMUNITY)

## 2024-11-23 ENCOUNTER — Ambulatory Visit: Admitting: Cardiovascular Disease
